# Patient Record
Sex: Female | Born: 1980 | Race: Black or African American | Hispanic: No | Marital: Single | Smoking: Never smoker
Health system: Southern US, Community
[De-identification: ages and names within clinical notes are randomized; demographics above are authoritative.]

## PROBLEM LIST (undated history)

## (undated) DIAGNOSIS — E119 Type 2 diabetes mellitus without complications: Secondary | ICD-10-CM

## (undated) HISTORY — PX: TUBAL LIGATION: SHX77

---

## 2012-01-31 ENCOUNTER — Emergency Department: Payer: Self-pay | Admitting: Emergency Medicine

## 2014-06-03 ENCOUNTER — Encounter: Payer: Self-pay | Admitting: Emergency Medicine

## 2014-06-03 ENCOUNTER — Emergency Department
Admission: EM | Admit: 2014-06-03 | Discharge: 2014-06-03 | Disposition: A | Discharge status: Home / Self Care | Payer: Self-pay | Attending: Emergency Medicine | Admitting: Emergency Medicine

## 2014-06-03 DIAGNOSIS — K029 Dental caries, unspecified: Secondary | ICD-10-CM | POA: Insufficient documentation

## 2014-06-03 DIAGNOSIS — S025XXA Fracture of tooth (traumatic), initial encounter for closed fracture: Secondary | ICD-10-CM

## 2014-06-03 DIAGNOSIS — K0381 Cracked tooth: Secondary | ICD-10-CM | POA: Insufficient documentation

## 2014-06-03 MED ORDER — AMOXICILLIN 500 MG PO TABS: 500.0000 mg | ORAL_TABLET | Freq: Three times a day (TID) | ORAL | Status: DC

## 2014-06-03 MED ORDER — OXYCODONE-ACETAMINOPHEN 5-325 MG PO TABS
ORAL_TABLET | ORAL | Status: AC
  Filled 2014-06-03: qty 1

## 2014-06-03 MED ORDER — OXYCODONE-ACETAMINOPHEN 5-325 MG PO TABS: 2.0000 | ORAL_TABLET | Freq: Once | ORAL | Status: DC

## 2014-06-03 MED ORDER — OXYCODONE-ACETAMINOPHEN 5-325 MG PO TABS: 1.0000 | ORAL_TABLET | ORAL | Status: DC | PRN

## 2014-06-03 NOTE — ED Notes (Signed)
Dental pain , left lower jaw pain , broken tooth to region of pain

## 2014-06-03 NOTE — ED Notes (Signed)
States she has broken teeth on lower gum  Increased pain and swelling

## 2014-06-03 NOTE — ED Provider Notes (Signed)
[facility] Emergency Department Provider Note    ____________________________________________  Time seen: ----------------------------------------- 9:20 AM on 06/03/2014 -----------------------------------------    I have reviewed the triage vital signs and the nursing notes.   HISTORY  Chief Complaint Dental Pain      HPI Regina Schmidt is a 34 y.o. female presents to the emergency room for evaluation of dental pain. Patient states that she's got to both sides of her lower mouth numbers 32 and 17. Complaining of pain. Has not been able to call the dentist. Try Motrin 800 last night with no relief. Describes pain as 10 over 10 at this time with some swelling noted to the left side of her face.    No past medical history on file.  There are no active problems to display for this patient.   No past surgical history on file.  Current Outpatient Rx  Name  Route  Sig  Dispense  Refill  . amoxicillin (AMOXIL) 500 MG tablet   Oral   Take 1 tablet (500 mg total) by mouth 3 (three) times daily.   30 tablet   0   . oxyCODONE-acetaminophen (ROXICET) 5-325 MG per tablet   Oral   Take 1-2 tablets by mouth every 4 (four) hours as needed for severe pain.   15 tablet   0     Allergies Review of patient's allergies indicates no known allergies.  No family history on file.  Social History History  Substance Use Topics  . Smoking status: Never Smoker   . Smokeless tobacco: Not on file  . Alcohol Use: No    Review of Systems  Constitutional: Negative for fever. Eyes: Negative for visual changes. ENT: Negative for sore throat. Cardiovascular: Negative for chest pain. Respiratory: Negative for shortness of breath. Gastrointestinal: Negative for abdominal pain, vomiting and diarrhea. Genitourinary: Negative for dysuria. Musculoskeletal: Negative for back pain. Skin: Negative for rash. Neurological: Negative for headaches, focal weakness or  numbness.   10-point ROS otherwise negative.  ____________________________________________   PHYSICAL EXAM:  VITAL SIGNS: ED Triage Vitals  Enc Vitals Group     BP 06/03/14 0836 167/87 mmHg     Pulse Rate 06/03/14 0836 72     Resp 06/03/14 0836 18     Temp 06/03/14 0836 99 F (37.2 C)     Temp Source 06/03/14 0836 Oral     SpO2 06/03/14 0836 100 %     Weight 06/03/14 0836 203 lb (92.08 kg)     Height 06/03/14 0836 5\' 2"  (1.575 m)     Head Cir --      Peak Flow --      Pain Score 06/03/14 0836 10     Pain Loc --      Pain Edu? --      Excl. in GC? --     Constitutional: Alert and oriented. Well appearing and in no distress. Eyes: Conjunctivae are normal. PERRL. Normal extraocular movements. ENT   Head: Normocephalic and atraumatic.   Nose: No congestion/rhinnorhea.   Mouth/Throat: Mucous membranes are moist. Positive dental caries noted. With right facial jaw swelling on the left side minimal.   Neck: No stridor. Hematological/Lymphatic/Immunilogical: No cervical lymphadenopathy. Cardiovascular: Normal rate, regular rhythm. Normal and symmetric distal pulses are present in all extremities. No murmurs, rubs, or gallops. Respiratory: Normal respiratory effort without tachypnea nor retractions. Breath sounds are clear and equal bilaterally. No wheezes/rales/rhonchi. Psychiatric: Mood and affect are normal. Speech and behavior are normal. Patient exhibits  appropriate insight and judgment.  ____________________________________________    LABS (pertinent positives/negatives)  Not applicable  ____________________________________________   EKG  Not applicable  ____________________________________________    RADIOLOGY  Not applicable  ____________________________________________   PROCEDURES  Procedure(s) performed: None  Critical Care performed: No  ____________________________________________   INITIAL IMPRESSION / ASSESSMENT AND PLAN /  ED COURSE  Pertinent labs & imaging results that were available during my care of the patient were reviewed by me and considered in my medical decision making (see chart for details).  Discussed findings with patient and need for follow-up care with dentist to get full resolution of symptoms. Patient understands no other EMC at this time.  ____________________________________________   FINAL CLINICAL IMPRESSION(S) / ED DIAGNOSES  Final diagnoses:  Dental caries  Tooth fracture, closed, initial encounter     Evangeline DakinCharles M Lovada Barwick, PA-C 06/03/14 1039  Jene Everyobert Kinner, MD 06/03/14 [phone]

## 2014-06-03 NOTE — Discharge Instructions (Signed)
OPTIONS FOR DENTAL FOLLOW UP CARE ° °[place] [facility] and Human Services - Local Safety Net Dental Clinics °http://www.ncdhhs.gov/dph/oralhealth/services/safetynetclinics.htm °  °[facility] (336-562-3123) ° °[facility] [place] (919-933-9087) ° °[facility] ([phone] ext [phone]) ° °[facility] ([phone]) ° °[facility] ([phone]) °This clinic caters to the indigent population and is on a lottery system. °Location: °[facility], [address], [place] °Clinic Hours: °Wednesdays from 6pm - 9pm, patients seen by a lottery system. °For dates, call or go to [email or web address]/[facility]/[email or web address] °Services: °Cleanings, fillings and simple extractions. °Payment Options: °DENTAL WORK IS FREE OF CHARGE. Bring proof of income or support. °Best way to get seen: °Arrive at 5:15 pm - this is a lottery, NOT first come/first serve, so arriving earlier will not increase your chances of being seen. °  °  °[facility] °[phone] °Select option 1 for emergencies °  °Location: °[facility], [address], [place] °Clinic Hours: °No walk-ins accepted - call the day before to schedule an appointment. °Check in times are 9:30 am and 1:30 pm. °Services: °Simple extractions, temporary fillings, pulpectomy/pulp debridement, uncomplicated abscess drainage. °Payment Options: °PAYMENT IS DUE AT THE TIME OF SERVICE.  Fee is usually $100-200, additional surgical procedures (e.g. abscess drainage) may be extra. °Cash, checks, Visa/MasterCard accepted.  Can file Medicaid if patient is covered for dental - patient should call case worker to check. °No discount for [facility] patients. °Best way to get seen: °MUST call the day before and get onto the schedule. Can usually be seen the next 1-2 days. No walk-ins accepted. °  °  °[facility] °[phone] °   °Location: °[facility], [address], [place] °Clinic Hours: °M, W, Th, F 8am or 1:30pm, Tues 9a or 1:30 - first come/first served. °Services: °Simple extractions, temporary fillings, uncomplicated abscess drainage.  You do not need to be an [place] resident. °Payment Options: °PAYMENT IS DUE AT THE TIME OF SERVICE. °Dental insurance, otherwise sliding scale - bring proof of income or support. °Depending on income and treatment needed, cost is usually $50-200. °Best way to get seen: °Arrive early as it is first come/first served. °  °  °[facility] °[phone] °  °Location: °[address] °Clinic Hours: °Mon-Thu 8a-5p °Services: °Most basic dental services including extractions and fillings. °Payment Options: °PAYMENT IS DUE AT THE TIME OF SERVICE. °Sliding scale, up to 50% off - bring proof if income or support. °Medicaid with dental option accepted. °Best way to get seen: °Call to schedule an appointment, can usually be seen within 2 weeks OR they will try to see walk-ins - show up at 8a or 2p (you may have to wait). °  °  °[facility] °919-245-2435 °[place] RESIDENTS ONLY °  °Location: °[facility], [address], [place],  [address] °Clinic Hours: By appointment only. °Monday - Thursday 8am-5pm, Friday 8am-12pm °Services: Cleanings, fillings, extractions. °Payment Options: °PAYMENT IS DUE AT THE TIME OF SERVICE. °Cash, Visa or MasterCard. Sliding scale - $30 minimum per service. °Best way to get seen: °Come in to office, complete packet and make an appointment - need proof of income °or support monies for each household member and proof of [place] residence. °Usually takes about a month to get in. °  °  °[facility] °[phone] °  °Location: °[address]   [place] °Clinic Hours: Walk-in Urgent Care Dental Services are offered Monday-Friday  mornings only. °The numbers of emergencies accepted daily is limited to the number of °providers available. °Maximum 15 - Mondays, Wednesdays & Thursdays °Maximum 10 - Tuesdays & Fridays °Services: °You do not need to be a [place] resident to be seen for a dental emergency. °Emergencies are defined as pain, swelling, abnormal bleeding, or dental trauma. Walkins will receive x-rays if needed. °NOTE: Dental cleaning is not an emergency. °Payment Options: °PAYMENT IS DUE AT THE TIME OF SERVICE. °Minimum co-pay is $40.00 for uninsured patients. °Minimum co-pay is $3.00 for Medicaid with dental coverage. °Dental Insurance is accepted and must be presented at time of visit. °Medicare does not cover dental. °Forms of payment: Cash, credit card, checks. °Best way to get seen: °If not previously registered with the clinic, walk-in dental registration begins at 7:15 am and is on a first come/first serve basis. °If previously registered with the clinic, call to make an appointment. °  °  °[facility] °[phone] °[place] RESIDENTS ONLY °  °Location: °[address], [place], [place] °Clinic Hours: °Mon-Thu 10a-2p °Services: Extractions only! °Payment Options: °FREE (donations accepted) - bring proof of income or support °Best way to get seen: °Call and schedule an appointment OR come at 8am on the 1st Monday of every month (except for holidays) when it is first come/first served. °  °  °[facility] °[phone] °  °Location: °[address], [place] °Clinic Hours: °Friday mornings °Services, Payment Options, Best way to get seen: °Call for info °Dental Caries °Dental caries (also called tooth decay) is the most common oral disease. It can occur at any age but is more common in children and young adults.  °HOW DENTAL CARIES DEVELOPS  °The process of decay begins when bacteria and foods (particularly sugars and starches) combine in your mouth to produce plaque. Plaque is a substance that sticks to the  hard, outer surface of a tooth (enamel). The bacteria in plaque produce acids that attack enamel. These acids may also attack the root surface of a tooth (cementum) if it is exposed. Repeated attacks dissolve these surfaces and create holes in the tooth (cavities). If left untreated, the acids destroy the other layers of the tooth.  °RISK FACTORS °· Frequent sipping of sugary beverages.   °· Frequent snacking on sugary and starchy foods, especially those that easily get stuck in the teeth.   °· Poor oral hygiene.   °· Dry mouth.   °· Substance abuse such as methamphetamine abuse.   °· Broken or poor-fitting dental restorations.   °· Eating disorders.   °· Gastroesophageal reflux disease (GERD).   °· Certain radiation treatments to the head and neck. °SYMPTOMS °In the early stages of dental caries, symptoms are seldom present. Sometimes white, chalky areas may be seen on the enamel or other tooth layers. In later stages, symptoms may include: °· Pits and holes on the enamel. °· Toothache after sweet, hot, or cold foods or drinks are consumed. °· Pain around the tooth. °· Swelling around the tooth. °DIAGNOSIS  °Most of the time, dental caries is detected during a regular dental checkup. A diagnosis is made after a thorough medical and dental history is taken and the surfaces of your teeth are checked for signs of dental caries. Sometimes special instruments, such as lasers, are used to check for dental caries. Dental X-ray exams may be taken so that areas not visible to the eye (such as between the contact areas of the   teeth) can be checked for cavities.  TREATMENT  If dental caries is in its early stages, it may be reversed with a fluoride treatment or an application of a remineralizing agent at the dental office. Thorough brushing and flossing at home is needed to aid these treatments. If it is in its later stages, treatment depends on the location and extent of tooth destruction:   If a small area of the tooth  has been destroyed, the destroyed area will be removed and cavities will be filled with a material such as gold, silver amalgam, or composite resin.   If a large area of the tooth has been destroyed, the destroyed area will be removed and a cap (crown) will be fitted over the remaining tooth structure.   If the center part of the tooth (pulp) is affected, a procedure called a root canal will be needed before a filling or crown can be placed.   If most of the tooth has been destroyed, the tooth may need to be pulled (extracted). HOME CARE INSTRUCTIONS You can prevent, stop, or reverse dental caries at home by practicing good oral hygiene. Good oral hygiene includes:  Thoroughly cleaning your teeth at least twice a day with a toothbrush and dental floss.   Using a fluoride toothpaste. A fluoride mouth rinse may also be used if recommended by your dentist or health care provider.   Restricting the amount of sugary and starchy foods and sugary liquids you consume.   Avoiding frequent snacking on these foods and sipping of these liquids.   Keeping regular visits with a dentist for checkups and cleanings. PREVENTION   Practice good oral hygiene.  Consider a dental sealant. A dental sealant is a coating material that is applied by your dentist to the pits and grooves of teeth. The sealant prevents food from being trapped in them. It may protect the teeth for several years.  Ask about fluoride supplements if you live in a community without fluorinated water or with water that has a low fluoride content. Use fluoride supplements as directed by your dentist or health care provider.  Allow fluoride varnish applications to teeth if directed by your dentist or health care provider. Document Released: 10/07/2001 Document Revised: 06/01/2013 Document Reviewed: 01/18/2012 [facility] Patient Information 2015 [place]. This information is not intended to replace advice given to you by your  health care provider. Make sure you discuss any questions you have with your health care provider.

## 2014-12-11 ENCOUNTER — Emergency Department
Admission: EM | Admit: 2014-12-11 | Discharge: 2014-12-11 | Disposition: A | Discharge status: Home / Self Care | Payer: Self-pay | Attending: Emergency Medicine | Admitting: Emergency Medicine

## 2014-12-11 DIAGNOSIS — L03313 Cellulitis of chest wall: Secondary | ICD-10-CM | POA: Insufficient documentation

## 2014-12-11 DIAGNOSIS — E119 Type 2 diabetes mellitus without complications: Secondary | ICD-10-CM | POA: Insufficient documentation

## 2014-12-11 DIAGNOSIS — Z792 Long term (current) use of antibiotics: Secondary | ICD-10-CM | POA: Insufficient documentation

## 2014-12-11 HISTORY — DX: Type 2 diabetes mellitus without complications: E11.9

## 2014-12-11 MED ORDER — SULFAMETHOXAZOLE-TRIMETHOPRIM 800-160 MG PO TABS: 1.0000 | ORAL_TABLET | Freq: Two times a day (BID) | ORAL | Status: DC

## 2014-12-11 MED ORDER — HYDROCODONE-ACETAMINOPHEN 5-325 MG PO TABS
1.0000 | ORAL_TABLET | ORAL | Status: DC | PRN
Start: 2014-12-11 — End: 2016-09-02

## 2014-12-11 NOTE — ED Notes (Signed)
Pt c/o tender.swollen area under the right breast for the past 2-3 days

## 2014-12-11 NOTE — Discharge Instructions (Signed)
Cellulitis Cellulitis is an infection of the skin and the tissue under the skin. The infected area is usually red and tender. This happens most often in the arms and lower legs. HOME CARE   Take your antibiotic medicine as told. Finish the medicine even if you start to feel better.  Keep the infected arm or leg raised (elevated).  Put a warm cloth on the area up to 4 times per day.  Only take medicines as told by your doctor.  Keep all doctor visits as told. GET HELP IF:  You see red streaks on the skin coming from the infected area.  Your red area gets bigger or turns a dark color.  Your bone or joint under the infected area is painful after the skin heals.  Your infection comes back in the same area or different area.  You have a puffy (swollen) bump in the infected area.  You have new symptoms.  You have a fever. GET HELP RIGHT AWAY IF:   You feel very sleepy.  You throw up (vomit) or have watery poop (diarrhea).  You feel sick and have muscle aches and pains.   This information is not intended to replace advice given to you by your health care provider. Make sure you discuss any questions you have with your health care provider.   Document Released: 07/04/2007 Document Revised: 10/06/2014 Document Reviewed: 04/02/2011 Elsevier Interactive Patient Education 2016 [employer] Inc.    Warm moist compresses frequently to the area. Take medication as prescribed. Return to the emergency room if any severe worsening of her symptoms or no drainage in the next 1-2 days.

## 2014-12-11 NOTE — ED Provider Notes (Signed)
[facility] Emergency Department Provider Note  ____________________________________________  Time seen: Approximately 10:09 AM  I have reviewed the triage vital signs and the nursing notes.   HISTORY  Chief Complaint Abscess  HPI Regina Schmidt is a 34 y.o. female is here with complaint of swollen tender area just under her right breast for the last 2-3 days. Patient states she had one prior to this it opened and drained on its on. She states this is very painful. She has not taken any over-the-counter medication for this. She is unaware of having MRSA in the past. She rates her pain an 8 out of 10. Pain is constant and worse since she touches it or if she moves a certain way. She states nothing is helping with pain.   Past Medical History  Diagnosis Date  . Diabetes mellitus without complication (HCC)     gestational    There are no active problems to display for this patient.   Past Surgical History  Procedure Laterality Date  . Tubal ligation    . Cesarean section      Current Outpatient Rx  Name  Route  Sig  Dispense  Refill  . amoxicillin (AMOXIL) 500 MG tablet   Oral   Take 1 tablet (500 mg total) by mouth 3 (three) times daily.   30 tablet   0   . HYDROcodone-acetaminophen (NORCO/VICODIN) 5-325 MG tablet   Oral   Take 1 tablet by mouth every 4 (four) hours as needed for moderate pain.   20 tablet   0   . oxyCODONE-acetaminophen (ROXICET) 5-325 MG per tablet   Oral   Take 1-2 tablets by mouth every 4 (four) hours as needed for severe pain.   15 tablet   0   . sulfamethoxazole-trimethoprim (BACTRIM DS,SEPTRA DS) 800-160 MG tablet   Oral   Take 1 tablet by mouth 2 (two) times daily.   20 tablet   0     Allergies Review of patient's allergies indicates no known allergies.  No family history on file.  Social History Social History  Substance Use Topics  . Smoking status: Never Smoker   . Smokeless tobacco: None  .  Alcohol Use: No    Review of Systems Constitutional: No fever/chills ENT: No sore throat. Cardiovascular: Denies chest pain. Respiratory: Denies shortness of breath. Gastrointestinal:   No nausea, no vomiting.  Genitourinary: Negative for dysuria. Musculoskeletal: Negative for back pain. Skin: Positive abscess  Neurological: Negative for headaches, focal weakness or numbness.  10-point ROS otherwise negative.  ____________________________________________   PHYSICAL EXAM:  VITAL SIGNS: ED Triage Vitals  Enc Vitals Group     BP 12/11/14 0923 150/69 mmHg     Pulse Rate 12/11/14 0923 95     Resp 12/11/14 0923 18     Temp 12/11/14 0923 99 F (37.2 C)     Temp Source 12/11/14 0923 Oral     SpO2 12/11/14 0923 98 %     Weight 12/11/14 0923 202 lb (91.627 kg)     Height 12/11/14 0923 5\' 2"  (1.575 m)     Head Cir --      Peak Flow --      Pain Score 12/11/14 0924 8     Pain Loc --      Pain Edu? --      Excl. in GC? --     Constitutional: Alert and oriented. Well appearing and in no acute distress. Eyes: Conjunctivae are normal. PERRL. EOMI.  Head: Atraumatic. Nose: No congestion/rhinnorhea. Neck: No stridor.   Cardiovascular: Normal rate, regular rhythm. Grossly normal heart sounds.  Good peripheral circulation. Respiratory: Normal respiratory effort.  No retractions. Lungs CTAB. Gastrointestinal: Soft and nontender. No distention.  Musculoskeletal: Moves upper and lower extremities without any difficulty. No lower extremity tenderness nor edema.  No joint effusions. Neurologic:  Normal speech and language. No gross focal neurologic deficits are appreciated. No gait instability. Skin:  Skin is warm, dry and intact. Right side lateral chest there is a 4 cm erythematous area that is extremely tender to touch. There is no localized fluctuant area.  Psychiatric: Mood and affect are normal. Speech and behavior are normal.  ____________________________________________    LABS (all labs ordered are listed, but only abnormal results are displayed)  Labs Reviewed - No data to display  PROCEDURES  Procedure(s) performed: None  Critical Care performed: No  ____________________________________________   INITIAL IMPRESSION / ASSESSMENT AND PLAN / ED COURSE  Pertinent labs & imaging results that were available during my care of the patient were reviewed by me and considered in my medical decision making (see chart for details).  Patient was placed on Bactrim DS twice a day for 10 days. She is also given a prescription for Norco as needed for pain. She is instructed to use warm compresses to the area frequently and return to the emergency room if area becomes soft or not draining in 2 days ____________________________________________   FINAL CLINICAL IMPRESSION(S) / ED DIAGNOSES  Final diagnoses:  Cellulitis of chest wall      Tommi Rumps, PA-C 12/11/14 1402  Darien Ramus, MD 12/11/14 1517

## 2014-12-15 ENCOUNTER — Emergency Department
Admission: EM | Admit: 2014-12-15 | Discharge: 2014-12-15 | Disposition: A | Discharge status: Home / Self Care | Payer: Self-pay | Attending: Emergency Medicine | Admitting: Emergency Medicine

## 2014-12-15 DIAGNOSIS — Z79899 Other long term (current) drug therapy: Secondary | ICD-10-CM | POA: Insufficient documentation

## 2014-12-15 DIAGNOSIS — L0291 Cutaneous abscess, unspecified: Secondary | ICD-10-CM

## 2014-12-15 DIAGNOSIS — L02211 Cutaneous abscess of abdominal wall: Secondary | ICD-10-CM | POA: Insufficient documentation

## 2014-12-15 DIAGNOSIS — Z792 Long term (current) use of antibiotics: Secondary | ICD-10-CM | POA: Insufficient documentation

## 2014-12-15 DIAGNOSIS — E119 Type 2 diabetes mellitus without complications: Secondary | ICD-10-CM | POA: Insufficient documentation

## 2014-12-15 MED ORDER — LIDOCAINE-EPINEPHRINE (PF) 1 %-1:200000 IJ SOLN
30.0000 mL | Freq: Once | INTRAMUSCULAR | Status: AC
  Administered 2014-12-15: 30 mL

## 2014-12-15 MED ORDER — HYDROCODONE-ACETAMINOPHEN 5-325 MG PO TABS
ORAL_TABLET | ORAL | Status: AC
  Administered 2014-12-15: 1 via ORAL
  Filled 2014-12-15: qty 1

## 2014-12-15 MED ORDER — HYDROCODONE-ACETAMINOPHEN 5-325 MG PO TABS
1.0000 | ORAL_TABLET | Freq: Once | ORAL | Status: AC
  Administered 2014-12-15: 1 via ORAL

## 2014-12-15 MED ORDER — LIDOCAINE-EPINEPHRINE (PF) 1 %-1:200000 IJ SOLN
INTRAMUSCULAR | Status: AC
  Administered 2014-12-15: 30 mL
  Filled 2014-12-15: qty 30

## 2014-12-15 NOTE — ED Provider Notes (Signed)
[facility] Emergency Department Provider Note ____________________________________________  Time seen: Approximately 8:35 AM  I have reviewed the triage vital signs and the nursing notes.   HISTORY  Chief Complaint Wound Check   HPI Regina Schmidt is a 34 y.o. female who presents to the emergency department for a second evaluation of abscess on the upper abdomen under the right breast. She has been taking bactrim as prescribed on 12/11/14. The area is smaller in size, but remains painful. She denies fever.  Past Medical History  Diagnosis Date  . Diabetes mellitus without complication (HCC)     gestational    There are no active problems to display for this patient.   Past Surgical History  Procedure Laterality Date  . Tubal ligation    . Cesarean section      Current Outpatient Rx  Name  Route  Sig  Dispense  Refill  . amoxicillin (AMOXIL) 500 MG tablet   Oral   Take 1 tablet (500 mg total) by mouth 3 (three) times daily.   30 tablet   0   . HYDROcodone-acetaminophen (NORCO/VICODIN) 5-325 MG tablet   Oral   Take 1 tablet by mouth every 4 (four) hours as needed for moderate pain.   20 tablet   0   . oxyCODONE-acetaminophen (ROXICET) 5-325 MG per tablet   Oral   Take 1-2 tablets by mouth every 4 (four) hours as needed for severe pain.   15 tablet   0   . sulfamethoxazole-trimethoprim (BACTRIM DS,SEPTRA DS) 800-160 MG tablet   Oral   Take 1 tablet by mouth 2 (two) times daily.   20 tablet   0     Allergies Review of patient's allergies indicates no known allergies.  No family history on file.  Social History Social History  Substance Use Topics  . Smoking status: Never Smoker   . Smokeless tobacco: None  . Alcohol Use: No    Review of Systems   Constitutional: No fever/chills Eyes: No visual changes. ENT: No congestion or rhinorrhea Cardiovascular: Denies chest pain. Respiratory: Denies shortness of  breath. Gastrointestinal: No abdominal pain.  No nausea, no vomiting.  No diarrhea.  No constipation. Genitourinary: Negative for dysuria. Musculoskeletal: Negative for back pain. Skin: Tenderness to upper abdomen under right breast. Neurological: Negative for headaches, focal weakness or numbness. 10-point ROS otherwise negative.  ____________________________________________   PHYSICAL EXAM:  VITAL SIGNS: ED Triage Vitals  Enc Vitals Group     BP 12/15/14 0812 131/77 mmHg     Pulse Rate 12/15/14 0812 99     Resp 12/15/14 0812 20     Temp 12/15/14 0812 98.7 F (37.1 C)     Temp Source 12/15/14 0812 Oral     SpO2 12/15/14 0812 100 %     Weight 12/15/14 0812 207 lb (93.895 kg)     Height 12/15/14 0812 5\' 7"  (1.702 m)     Head Cir --      Peak Flow --      Pain Score 12/15/14 0812 6     Pain Loc --      Pain Edu? --      Excl. in GC? --    Constitutional: Alert and oriented. Well appearing and in no acute distress. Eyes: Conjunctivae are normal. PERRL. EOMI. Head: Atraumatic. Nose: No congestion/rhinnorhea. Mouth/Throat: Mucous membranes are moist.  Oropharynx non-erythematous. No oral lesions. Neck: No stridor. Cardiovascular: Normal rate, regular rhythm.  Good peripheral circulation. Respiratory: Normal respiratory effort.  No retractions. Lungs CTAB. Gastrointestinal: Soft and nontender. No distention. No abdominal bruits.  Musculoskeletal: No lower extremity tenderness nor edema.  No joint effusions. Neurologic:  Normal speech and language. No gross focal neurologic deficits are appreciated. Speech is normal. No gait instability. Skin:  Tender, erythematous, fluctuant area approximately 4cm under right breast; Negative for petechiae.  Psychiatric: Mood and affect are normal. Speech and behavior are normal.  ____________________________________________   LABS (all labs ordered are listed, but only abnormal results are displayed)  Labs Reviewed - No data to  display ____________________________________________  EKG   ____________________________________________  RADIOLOGY  Not indicated ____________________________________________   PROCEDURES  Procedure(s) performed:  INCISION AND DRAINAGE Performed by: Kem Boroughs Consent: Verbal consent obtained. Risks and benefits: risks, benefits and alternatives were discussed Type: abscess  Body area: right upper abdomen under right breast  Anesthesia: local infiltration  Incision was made with a scalpel.  Local anesthetic: lidocaine 1% with epinephrine  Anesthetic total: 6 ml  Complexity: complex  Blunt dissection to break up loculations  Drainage: purulent  Drainage amount: Large  Packing material: 1/4 in iodoform gauze  Patient tolerance: Patient tolerated the procedure well with no immediate complications.    ____________________________________________   INITIAL IMPRESSION / ASSESSMENT AND PLAN / ED COURSE  Pertinent labs & imaging results that were available during my care of the patient were reviewed by me and considered in my medical decision making (see chart for details).  Patient to return in 2 days for packing removal and recheck or see PCP. She is to return sooner for symptoms that change or worsen or for new concerns. She will continue the antibiotic as prescribed. ____________________________________________   FINAL CLINICAL IMPRESSION(S) / ED DIAGNOSES  Final diagnoses:  Abscess       Chinita Pester, FNP 12/15/14 [id number]  Emily Filbert, MD 12/15/14 [phone]

## 2014-12-15 NOTE — Discharge Instructions (Signed)
Incision and Drainage °Incision and drainage is a procedure in which a sac-like structure (cystic structure) is opened and drained. The area to be drained usually contains material such as pus, fluid, or blood.  °LET YOUR CAREGIVER KNOW ABOUT:  °· Allergies to medicine. °· Medicines taken, including vitamins, herbs, eyedrops, over-the-counter medicines, and creams. °· Use of steroids (by mouth or creams). °· Previous problems with anesthetics or numbing medicines. °· History of bleeding problems or blood clots. °· Previous surgery. °· Other health problems, including diabetes and kidney problems. °· Possibility of pregnancy, if this applies. °RISKS AND COMPLICATIONS °· Pain. °· Bleeding. °· Scarring. °· Infection. °BEFORE THE PROCEDURE  °You may need to have an ultrasound or other imaging tests to see how large or deep your cystic structure is. Blood tests may also be used to determine if you have an infection or how severe the infection is. You may need to have a tetanus shot. °PROCEDURE  °The affected area is cleaned with a cleaning fluid. The cyst area will then be numbed with a medicine (local anesthetic). A small incision will be made in the cystic structure. A syringe or catheter may be used to drain the contents of the cystic structure, or the contents may be squeezed out. The area will then be flushed with a cleansing solution. After cleansing the area, it is often gently packed with a gauze or another wound dressing. Once it is packed, it will be covered with gauze and tape or some other type of wound dressing.  °AFTER THE PROCEDURE  °· Often, you will be allowed to go home right after the procedure. °· You may be given antibiotic medicine to prevent or heal an infection. °· If the area was packed with gauze or some other wound dressing, you will likely need to come back in 1 to 2 days to get it removed. °· The area should heal in about 14 days. °  °This information is not intended to replace advice given  to you by your health care provider. Make sure you discuss any questions you have with your health care provider. °  °Document Released: 07/11/2000 Document Revised: 07/17/2011 Document Reviewed: 03/12/2011 °Elsevier Interactive Patient Education ©2016 [employer] ° °Abscess °An abscess is an infected area that contains a collection of pus and debris. It can occur in almost any part of the body. An abscess is also known as a furuncle or boil. °CAUSES  °An abscess occurs when tissue gets infected. This can occur from blockage of oil or sweat glands, infection of hair follicles, or a minor injury to the skin. As the body tries to fight the infection, pus collects in the area and creates pressure under the skin. This pressure causes pain. People with weakened immune systems have difficulty fighting infections and get certain abscesses more often.  °SYMPTOMS °Usually an abscess develops on the skin and becomes a painful mass that is red, warm, and tender. If the abscess forms under the skin, you may feel a moveable soft area under the skin. Some abscesses break open (rupture) on their own, but most will continue to get worse without care. The infection can spread deeper into the body and eventually into the bloodstream, causing you to feel ill.  °DIAGNOSIS  °Your caregiver will take your medical history and perform a physical exam. A sample of fluid may also be taken from the abscess to determine what is causing your infection. °TREATMENT  °Your caregiver may prescribe antibiotic medicines to fight the infection.   However, taking antibiotics alone usually does not cure an abscess. Your caregiver may need to make a small cut (incision) in the abscess to drain the pus. In some cases, gauze is packed into the abscess to reduce pain and to continue draining the area. °HOME CARE INSTRUCTIONS  °· Only take over-the-counter or prescription medicines for pain, discomfort, or fever as directed by your caregiver. °· If you were  prescribed antibiotics, take them as directed. Finish them even if you start to feel better. °· If gauze is used, follow your caregiver's directions for changing the gauze. °· To avoid spreading the infection: °¨ Keep your draining abscess covered with a bandage. °¨ Wash your hands well. °¨ Do not share personal care items, towels, or whirlpools with others. °¨ Avoid skin contact with others. °· Keep your skin and clothes clean around the abscess. °· Keep all follow-up appointments as directed by your caregiver. °SEEK MEDICAL CARE IF:  °· You have increased pain, swelling, redness, fluid drainage, or bleeding. °· You have muscle aches, chills, or a general ill feeling. °· You have a fever. °MAKE SURE YOU:  °· Understand these instructions. °· Will watch your condition. °· Will get help right away if you are not doing well or get worse. °  °This information is not intended to replace advice given to you by your health care provider. Make sure you discuss any questions you have with your health care provider. °  °Document Released: 10/25/2004 Document Revised: 07/17/2011 Document Reviewed: 03/30/2011 °Elsevier Interactive Patient Education ©2016 [employer] ° °

## 2014-12-15 NOTE — ED Notes (Addendum)
Pt was seen a few days ago for abscess on right upper abd, states she was placed on abx and returns today because abscess is still present

## 2014-12-15 NOTE — ED Notes (Signed)
Pt states she is here for a wound check of abscess on abdomin

## 2016-09-02 ENCOUNTER — Encounter: Payer: Self-pay | Admitting: Emergency Medicine

## 2016-09-02 DIAGNOSIS — Z043 Encounter for examination and observation following other accident: Secondary | ICD-10-CM | POA: Diagnosis present
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
## 2016-09-03 ENCOUNTER — Emergency Department
Admission: EM | Admit: 2016-09-03 | Discharge: 2016-09-03 | Disposition: A | Discharge status: Home / Self Care | Payer: No Typology Code available for payment source | Attending: Emergency Medicine | Admitting: Emergency Medicine

## 2016-09-03 MED ORDER — IBUPROFEN 600 MG PO TABS
600.0000 mg | ORAL_TABLET | Freq: Once | ORAL | Status: AC
  Administered 2016-09-03: 600 mg via ORAL
  Filled 2016-09-03: qty 1

## 2016-09-03 MED ORDER — IBUPROFEN 600 MG PO TABS: 600.0000 mg | ORAL_TABLET | Freq: Three times a day (TID) | ORAL | 0 refills | Status: DC | PRN
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
## 2016-09-03 NOTE — ED Triage Notes (Signed)
Patient ambulating outside. 

## 2017-04-19 ENCOUNTER — Ambulatory Visit: Payer: Self-pay | Admitting: Obstetrics and Gynecology

## 2017-09-21 ENCOUNTER — Encounter: Payer: Self-pay | Admitting: Emergency Medicine

## 2017-09-21 ENCOUNTER — Emergency Department
Admission: EM | Admit: 2017-09-21 | Discharge: 2017-09-21 | Disposition: A | Discharge status: Home / Self Care | Payer: Self-pay | Attending: Emergency Medicine | Admitting: Emergency Medicine

## 2017-09-21 DIAGNOSIS — S39012A Strain of muscle, fascia and tendon of lower back, initial encounter: Secondary | ICD-10-CM | POA: Insufficient documentation

## 2017-09-21 DIAGNOSIS — E119 Type 2 diabetes mellitus without complications: Secondary | ICD-10-CM | POA: Insufficient documentation

## 2017-09-21 DIAGNOSIS — Y92410 Unspecified street and highway as the place of occurrence of the external cause: Secondary | ICD-10-CM | POA: Insufficient documentation

## 2017-09-21 DIAGNOSIS — Y999 Unspecified external cause status: Secondary | ICD-10-CM | POA: Insufficient documentation

## 2017-09-21 DIAGNOSIS — Y9389 Activity, other specified: Secondary | ICD-10-CM | POA: Insufficient documentation

## 2017-09-21 MED ORDER — IBUPROFEN 600 MG PO TABS: 600.0000 mg | ORAL_TABLET | Freq: Four times a day (QID) | ORAL | 0 refills | Status: DC | PRN

## 2017-09-21 MED ORDER — CYCLOBENZAPRINE HCL 5 MG PO TABS: 5.0000 mg | ORAL_TABLET | Freq: Three times a day (TID) | ORAL | 0 refills | Status: DC | PRN
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
## 2017-09-21 NOTE — Discharge Instructions (Addendum)
Please use Tylenol and ibuprofen for mild to moderate pain.  Use Flexeril as needed for muscle tightness and pain.  If no improvement 1 week follow-up with primary care provider.  If any worsening symptoms or urgent changes in health return to the emergency department.
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
## 2018-08-06 ENCOUNTER — Ambulatory Visit (INDEPENDENT_AMBULATORY_CARE_PROVIDER_SITE_OTHER): Payer: Managed Care, Other (non HMO)

## 2018-08-06 ENCOUNTER — Ambulatory Visit
Admission: EM | Admit: 2018-08-06 | Discharge: 2018-08-06 | Disposition: A | Discharge status: Home / Self Care | Payer: Managed Care, Other (non HMO) | Attending: Family Medicine | Admitting: Family Medicine

## 2018-08-06 ENCOUNTER — Other Ambulatory Visit: Payer: Self-pay

## 2018-08-06 ENCOUNTER — Encounter: Payer: Self-pay | Admitting: Emergency Medicine

## 2018-08-06 DIAGNOSIS — R05 Cough: Secondary | ICD-10-CM

## 2018-08-06 DIAGNOSIS — R0602 Shortness of breath: Secondary | ICD-10-CM | POA: Diagnosis not present

## 2018-08-06 DIAGNOSIS — R053 Chronic cough: Secondary | ICD-10-CM

## 2018-08-06 MED ORDER — ALBUTEROL SULFATE HFA 108 (90 BASE) MCG/ACT IN AERS: 1.0000 | INHALATION_SPRAY | Freq: Four times a day (QID) | RESPIRATORY_TRACT | 0 refills | Status: DC | PRN

## 2018-08-06 NOTE — ED Provider Notes (Signed)
[facility]    CSN: [id number] Arrival date & time: 08/06/18  1206  History   Chief Complaint Chief Complaint  Patient presents with  . Cough   HPI  38 year old female presents with cough, fatigue, SOB.  Patient reports that she has had an ongoing cough for the past 6 months.  She has yet to establish with a primary care provider.  She states that since Monday she has felt worse.  More fatigue.  Worsening cough.  Associated shortness of breath and chest congestion.  No documented fever.  No medications or interventions tried.  No reported sick contacts.  No other associated symptoms. No other complaints.  History reviewed as below. Past Medical History:  Diagnosis Date  . Diabetes mellitus without complication (HCC)    gestational  Dyspareunia  Past Surgical History:  Procedure Laterality Date  . CESAREAN SECTION    . TUBAL LIGATION     OB History   No obstetric history on file.    Home Medications    Prior to Admission medications   Medication Sig Start Date End Date Taking? Authorizing Provider  albuterol (VENTOLIN HFA) 108 (90 Base) MCG/ACT inhaler Inhale 1-2 puffs into the lungs every 6 (six) hours as needed for wheezing or shortness of breath. 08/06/18   Tommie Samsook, Raynaldo Falco G, DO   Social History Social History   Tobacco Use  . Smoking status: Never Smoker  . Smokeless tobacco: Never Used  Substance Use Topics  . Alcohol use: No  . Drug use: No    Allergies   Patient has no known allergies.   Review of Systems Review of Systems  Constitutional: Positive for fatigue. Negative for fever.  Respiratory: Positive for cough and shortness of breath.    Physical Exam Triage Vital Signs ED Triage Vitals  Enc Vitals Group     BP 08/06/18 1222 (!) 145/80     Pulse Rate 08/06/18 1222 82     Resp 08/06/18 1222 18     Temp 08/06/18 1222 99.1 F (37.3 C)     Temp Source 08/06/18 1222 Oral     SpO2 08/06/18 1222 100 %     Weight 08/06/18 1220 210 lb (95.3  kg)     Height 08/06/18 1220 5\' 4"  (1.626 m)     Head Circumference --      Peak Flow --      Pain Score 08/06/18 1218 5     Pain Loc --      Pain Edu? --      Excl. in GC? --    Updated Vital Signs BP (!) 145/80 (BP Location: Right Arm)   Pulse 82   Temp 99.1 F (37.3 C) (Oral)   Resp 18   Ht 5\' 4"  (1.626 m)   Wt 95.3 kg   LMP 07/30/2018   SpO2 100%   BMI 36.05 kg/m   Visual Acuity Right Eye Distance:   Left Eye Distance:   Bilateral Distance:    Right Eye Near:   Left Eye Near:    Bilateral Near:     Physical Exam Vitals signs and nursing note reviewed.  Constitutional:      General: She is not in acute distress.    Appearance: Normal appearance.  HENT:     Head: Normocephalic and atraumatic.  Eyes:     General:        Right eye: No discharge.        Left eye: No discharge.  Conjunctiva/sclera: Conjunctivae normal.  Cardiovascular:     Rate and Rhythm: Normal rate and regular rhythm.  Pulmonary:     Effort: Pulmonary effort is normal.     Breath sounds: Normal breath sounds. No wheezing, rhonchi or rales.  Neurological:     Mental Status: She is alert.  Psychiatric:        Mood and Affect: Mood normal.        Behavior: Behavior normal.    UC Treatments / Results  Labs (all labs ordered are listed, but only abnormal results are displayed) Labs Reviewed - No data to display  EKG   Radiology Dg Chest 2 View  Result Date: 08/06/2018 CLINICAL DATA:  Cough for 6 months. EXAM: CHEST - 2 VIEW COMPARISON:  None. FINDINGS: The heart size and mediastinal contours are within normal limits. There is mild linear atelectasis of right lung base. There is no focal infiltrate, pulmonary edema, or pleural effusion. The visualized skeletal structures are unremarkable. IMPRESSION: No focal pneumonia or pulmonary edema. Minimal atelectasis of right lung base. Electronically Signed   By: Abelardo Diesel M.D.   On: 08/06/2018 12:52    Procedures Procedures (including  critical care time)  Medications Ordered in UC Medications - No data to display  Initial Impression / Assessment and Plan / UC Course  I have reviewed the triage vital signs and the nursing notes.  Pertinent labs & imaging results that were available during my care of the patient were reviewed by me and considered in my medical decision making (see chart for details).    38 year old female presents with chronic cough with recent worsening. Chest xray negative. Albuterol as needed. Arranging COVID testing.  Final Clinical Impressions(s) / UC Diagnoses   Final diagnoses:  Chronic cough     Discharge Instructions     Chest xray negative.  Inhaler as prescribed.  You will receive a call regarding COVID testing.    ED Prescriptions    Medication Sig Dispense Auth. Provider   albuterol (VENTOLIN HFA) 108 (90 Base) MCG/ACT inhaler Inhale 1-2 puffs into the lungs every 6 (six) hours as needed for wheezing or shortness of breath. 18 g Coral Spikes, DO     Controlled Substance Prescriptions [place] Controlled Substance Registry consulted? Not Applicable   Coral Spikes, DO 08/06/18 1522

## 2018-08-06 NOTE — ED Triage Notes (Signed)
Patient c/o cough x 6 months, She states on Monday she became fatigued and states her cough became worse, having shortness of breath and congestion.

## 2018-08-06 NOTE — Discharge Instructions (Signed)
Chest xray negative.  Inhaler as prescribed.  You will receive a call regarding COVID testing.

## 2018-08-07 ENCOUNTER — Other Ambulatory Visit: Payer: Managed Care, Other (non HMO)

## 2018-08-07 ENCOUNTER — Telehealth: Payer: Self-pay | Admitting: *Deleted

## 2018-08-07 DIAGNOSIS — Z20822 Contact with and (suspected) exposure to covid-19: Secondary | ICD-10-CM

## 2018-08-07 NOTE — Telephone Encounter (Signed)
-----   Message from Coral Spikes, DO sent at 08/06/2018  1:00 PM EDT ----- Regarding: COVID testing needed. Needs testing.  [facility]

## 2018-08-07 NOTE — Telephone Encounter (Signed)
Spoke with patient.  Scheduled her for COVID 19 test today at [facility] at 12:45 pm.  Testing protocol reviewed.

## 2018-08-12 LAB — NOVEL CORONAVIRUS, NAA: SARS-CoV-2, NAA: NOT DETECTED

## 2018-11-25 ENCOUNTER — Other Ambulatory Visit (HOSPITAL_COMMUNITY)
Admission: RE | Admit: 2018-11-25 | Discharge: 2018-11-25 | Disposition: A | Discharge status: Home / Self Care | Payer: Managed Care, Other (non HMO) | Source: Ambulatory Visit | Attending: Certified Nurse Midwife | Admitting: Certified Nurse Midwife

## 2018-11-25 ENCOUNTER — Other Ambulatory Visit: Payer: Self-pay

## 2018-11-25 ENCOUNTER — Ambulatory Visit (INDEPENDENT_AMBULATORY_CARE_PROVIDER_SITE_OTHER): Payer: Managed Care, Other (non HMO) | Admitting: Certified Nurse Midwife

## 2018-11-25 ENCOUNTER — Encounter: Payer: Self-pay | Admitting: Certified Nurse Midwife

## 2018-11-25 VITALS — BP 114/55 | HR 80 | Ht 62.0 in | Wt 214.1 lb

## 2018-11-25 DIAGNOSIS — Z202 Contact with and (suspected) exposure to infections with a predominantly sexual mode of transmission: Secondary | ICD-10-CM

## 2018-11-25 DIAGNOSIS — Z124 Encounter for screening for malignant neoplasm of cervix: Secondary | ICD-10-CM | POA: Insufficient documentation

## 2018-11-25 DIAGNOSIS — Z1231 Encounter for screening mammogram for malignant neoplasm of breast: Secondary | ICD-10-CM

## 2018-11-25 DIAGNOSIS — Z136 Encounter for screening for cardiovascular disorders: Secondary | ICD-10-CM

## 2018-11-25 DIAGNOSIS — Z01419 Encounter for gynecological examination (general) (routine) without abnormal findings: Secondary | ICD-10-CM | POA: Diagnosis not present

## 2018-11-25 DIAGNOSIS — Z13220 Encounter for screening for lipoid disorders: Secondary | ICD-10-CM

## 2018-11-25 MED ORDER — ALBUTEROL SULFATE HFA 108 (90 BASE) MCG/ACT IN AERS: 1.0000 | INHALATION_SPRAY | Freq: Four times a day (QID) | RESPIRATORY_TRACT | 0 refills | Status: DC | PRN

## 2018-11-25 NOTE — Progress Notes (Signed)
GYNECOLOGY ANNUAL PREVENTATIVE CARE ENCOUNTER NOTE  History:     Regina Schmidt is a 38 y.o. No obstetric history on file. female here for a routine annual gynecologic exam.  Current complaints: vaginal discomfort for the past week.   Denies abnormal vaginal bleeding, discharge, pelvic pain, problems with intercourse or other gynecologic concerns.     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 Contraception: tubal ligation Last Pap: It has been a while.  Results were: normal per pt Last mammogram: has not had one  Obstetric History OB History  Gravida Para Term Preterm AB Living  3 3 2 1   3   SAB TAB Ectopic Multiple Live Births          3    # Outcome Date GA Lbr Len/2nd Weight Sex Delivery Anes PTL Lv  3 Term 01/27/04 [redacted]w[redacted]d  7 lb 8 oz (3.402 kg) M CS-Unspec  Y LIV  2 Term 07/24/02 [redacted]w[redacted]d  7 lb (3.175 kg) F CS-Unspec  Y LIV  1 Preterm 12/02/00 [redacted]w[redacted]d  6 lb 5 oz (2.863 kg) F CS-Unspec  Y LIV    Past Medical History:  Diagnosis Date  . Diabetes mellitus without complication (HCC)    gestational    Past Surgical History:  Procedure Laterality Date  . CESAREAN SECTION    . TUBAL LIGATION      No current outpatient medications on file prior to visit.   No current facility-administered medications on file prior to visit.     No Known Allergies  Social History:  reports that she has never smoked. She has never used smokeless tobacco. She reports that she does not drink alcohol or use drugs.  Family History  Problem Relation Age of Onset  . Breast cancer Mother   . Diabetes Mother     The following portions of the patient's history were reviewed and updated as appropriate: allergies, current medications, past family history, past medical history, past social history, past surgical history and problem list.  Review of Systems Pertinent items noted  in HPI and remainder of comprehensive ROS otherwise negative.  Exercises 3 x wk for 20-30 min. Denies smoking, vaping, Marijuana, drug use and less than 1 x month alcohol use.   Physical Exam:  BP (!) 114/55   Pulse 80   Ht 5\' 2"  (1.575 m)   Wt 214 lb 1 oz (97.1 kg)   LMP 11/11/2018 (Exact Date)   BMI 39.15 kg/m  CONSTITUTIONAL: Well-developed, well-nourished female obese in no acute distress.  HENT:  Normocephalic, atraumatic, External right and left ear normal. Oropharynx is clear and moist EYES: Conjunctivae and EOM are normal. Pupils are equal, round, and reactive to light. No scleral icterus.  NECK: Normal range of motion, supple, no masses.  Normal thyroid.  SKIN: Skin is warm and dry. No rash noted. Not diaphoretic. No erythema. No pallor. MUSCULOSKELETAL: Normal range of motion. No tenderness.  No cyanosis, clubbing, or edema.  2+ distal pulses. NEUROLOGIC: Alert and oriented to person, place, and time. Normal reflexes, muscle tone coordination. No cranial nerve deficit noted. PSYCHIATRIC: Normal mood and affect. Normal behavior. Normal judgment and thought content. CARDIOVASCULAR: Normal heart rate noted, regular rhythm RESPIRATORY: Clear to auscultation bilaterally. Effort and breath sounds normal, no problems with respiration noted. BREASTS: Symmetric in size. No masses, skin changes, nipple drainage, or lymphadenopathy. ABDOMEN: Soft, normal bowel sounds,  no distention noted.  No tenderness, rebound or guarding.  PELVIC: Normal appearing external genitalia; 2 small ulcerative lesions on the posterior rim of vagina, tender to touch. normal appearing vaginal mucosa and cervix.  No abnormal discharge noted.  Pap smear obtained with Difficult to due to pt closing legs and lifting bottom , moving up the exam table.  Uterus difficult to assess due to body habiuts and pt not tolerating exam. No uterine or adnexal tenderness.   Assessment and Plan:    Annual [facility] Women GYN Exam . Will  follow up results of pap smear and manage accordingly. Mammogram scheduled Labs: STD testing today, vaginal swab collected, lipid profile Refills: albuterol inhaler  Routine preventative health maintenance measures emphasized. Please refer to After Visit Summary for other counseling recommendations.      Philip Aspen, CNM

## 2018-11-25 NOTE — Patient Instructions (Signed)

## 2018-11-27 LAB — HIV ANTIBODY (ROUTINE TESTING W REFLEX): HIV Screen 4th Generation wRfx: NONREACTIVE

## 2018-11-27 LAB — RPR: RPR Ser Ql: REACTIVE — AB

## 2018-11-27 LAB — HSV 1 AND 2 IGM ABS, INDIRECT
HSV 1 IgM: <1:10 {titer}
HSV 2 IgM: <1:10 {titer}

## 2018-11-27 LAB — LIPID PANEL
Chol/HDL Ratio: 4.1 ratio (ref 0.0–4.4)
Cholesterol, Total: 152 mg/dL (ref 100–199)
HDL: 37 mg/dL — ABNORMAL LOW (ref >39)
LDL Chol Calc (NIH): 101 mg/dL — ABNORMAL HIGH (ref 0–99)
Triglycerides: 69 mg/dL (ref 0–149)
VLDL Cholesterol Cal: 14 mg/dL (ref 5–40)

## 2018-11-27 LAB — RPR, QUANT+TP ABS (REFLEX)
Rapid Plasma Reagin, Quant: 1:1 {titer} — ABNORMAL HIGH
T Pallidum Abs: NONREACTIVE

## 2018-11-27 LAB — HEPATITIS B SURFACE ANTIGEN: Hepatitis B Surface Ag: NEGATIVE

## 2018-11-28 ENCOUNTER — Other Ambulatory Visit: Payer: Self-pay | Admitting: Certified Nurse Midwife

## 2018-11-28 LAB — CERVICOVAGINAL ANCILLARY ONLY
Bacterial Vaginitis (gardnerella): POSITIVE — AB
Candida Glabrata: NEGATIVE
Candida Vaginitis: NEGATIVE
Chlamydia: NEGATIVE
Comment: NEGATIVE
Comment: NEGATIVE
Comment: NEGATIVE
Comment: NEGATIVE
Comment: NEGATIVE
Comment: NORMAL
Neisseria Gonorrhea: NEGATIVE
Trichomonas: NEGATIVE

## 2018-11-28 MED ORDER — METRONIDAZOLE 500 MG PO TABS: 500.0000 mg | ORAL_TABLET | Freq: Two times a day (BID) | ORAL | 0 refills | Status: AC

## 2018-11-28 NOTE — Progress Notes (Signed)
Orders placed for treatment of BV. Vaginal swab positive. Pt notified via my chart.  Philip Aspen, CNM

## 2018-12-02 LAB — CYTOLOGY - PAP
Adequacy: ABSENT
Comment: NEGATIVE
Diagnosis: NEGATIVE
High risk HPV: NEGATIVE

## 2018-12-12 ENCOUNTER — Other Ambulatory Visit: Payer: Self-pay

## 2018-12-12 ENCOUNTER — Ambulatory Visit
Admission: EM | Admit: 2018-12-12 | Discharge: 2018-12-12 | Disposition: A | Discharge status: Home / Self Care | Payer: Managed Care, Other (non HMO) | Attending: Family Medicine | Admitting: Family Medicine

## 2018-12-12 ENCOUNTER — Encounter: Payer: Self-pay | Admitting: Emergency Medicine

## 2018-12-12 DIAGNOSIS — S29012A Strain of muscle and tendon of back wall of thorax, initial encounter: Secondary | ICD-10-CM

## 2018-12-12 DIAGNOSIS — M549 Dorsalgia, unspecified: Secondary | ICD-10-CM

## 2018-12-12 MED ORDER — NAPROXEN 500 MG PO TABS: 500.0000 mg | ORAL_TABLET | Freq: Two times a day (BID) | ORAL | 0 refills | Status: DC | PRN

## 2018-12-12 MED ORDER — CYCLOBENZAPRINE HCL 10 MG PO TABS: ORAL_TABLET | ORAL | 0 refills | Status: DC

## 2018-12-12 NOTE — ED Provider Notes (Signed)
[facility]    CSN: [id number] Arrival date & time: 12/12/18  [id number]      History   Chief Complaint Chief Complaint  Patient presents with  . Shoulder Pain    right  . Back Pain    HPI Regina Schmidt is a 38 y.o. female.   38 year old female presents with right sided upper back pain that started early this morning (4AM). No distinct injury or trauma but woke up with the pain. Increased pain with movement of right arm/back. Denies any fever, numbness, or radiation of pain. No difficulty breathing or anterior chest pain. Took a BC with some relief this morning. No previous injury or similar episodes. Other chronic health issues include occasional asthma and uses Albuterol inhaler as needed.   The history is provided by the patient.    Past Medical History:  Diagnosis Date  . Diabetes mellitus without complication (HCC)    gestational    There are no active problems to display for this patient.   Past Surgical History:  Procedure Laterality Date  . CESAREAN SECTION    . TUBAL LIGATION      OB History    Gravida  3   Para  3   Term  2   Preterm  1   AB      Living  3     SAB      TAB      Ectopic      Multiple      Live Births  3            Home Medications    Prior to Admission medications   Medication Sig Start Date End Date Taking? Authorizing Provider  albuterol (VENTOLIN HFA) 108 (90 Base) MCG/ACT inhaler Inhale 1-2 puffs into the lungs every 6 (six) hours as needed for wheezing or shortness of breath. 11/25/18  Yes Doreene Burkehompson, Annie, CNM  cyclobenzaprine (FLEXERIL) 10 MG tablet Take 1/2 tablet by mouth every 8 hours as needed for muscle spasms. May take 1 whole tablet at night as needed. 12/12/18   Sudie GrumblingAmyot, Hetty Linhart Berry, NP  naproxen (NAPROSYN) 500 MG tablet Take 1 tablet (500 mg total) by mouth 2 (two) times daily as needed for moderate pain. 12/12/18   Sudie GrumblingAmyot, Taesha Goodell Berry, NP    Family History Family History  Problem Relation Age  of Onset  . Breast cancer Mother   . Diabetes Mother     Social History Social History   Tobacco Use  . Smoking status: Never Smoker  . Smokeless tobacco: Never Used  Substance Use Topics  . Alcohol use: No  . Drug use: No     Allergies   Patient has no known allergies.   Review of Systems Review of Systems  Constitutional: Negative for activity change, appetite change, chills, fatigue and fever.  Eyes: Negative for photophobia and visual disturbance.  Respiratory: Negative for cough, chest tightness, shortness of breath and wheezing.   Cardiovascular: Negative for chest pain and palpitations.  Gastrointestinal: Negative for diarrhea, nausea and vomiting.  Musculoskeletal: Positive for back pain, myalgias and neck pain. Negative for arthralgias, gait problem, joint swelling and neck stiffness.  Skin: Negative for color change, rash and wound.  Allergic/Immunologic: Negative for environmental allergies, food allergies and immunocompromised state.  Neurological: Negative for dizziness, tremors, seizures, syncope, weakness, light-headedness, numbness and headaches.  Hematological: Negative for adenopathy. Does not bruise/bleed easily.     Physical Exam Triage Vital Signs ED Triage Vitals  Enc Vitals Group     BP 12/12/18 0930 138/86     Pulse Rate 12/12/18 0930 69     Resp 12/12/18 0930 16     Temp 12/12/18 0930 98.3 F (36.8 C)     Temp Source 12/12/18 0930 Oral     SpO2 12/12/18 0930 100 %     Weight 12/12/18 0927 207 lb (93.9 kg)     Height 12/12/18 0927 5\' 2"  (1.575 m)     Head Circumference --      Peak Flow --      Pain Score 12/12/18 0927 8     Pain Loc --      Pain Edu? --      Excl. in GC? --    No data found.  Updated Vital Signs BP 138/86 (BP Location: Left Arm)   Pulse 69   Temp 98.3 F (36.8 C) (Oral)   Resp 16   Ht 5\' 2"  (1.575 m)   Wt 207 lb (93.9 kg)   LMP 11/28/2018 (Approximate)   SpO2 100%   BMI 37.86 kg/m   Visual Acuity Right  Eye Distance:   Left Eye Distance:   Bilateral Distance:    Right Eye Near:   Left Eye Near:    Bilateral Near:     Physical Exam Vitals signs and nursing note reviewed.  Constitutional:      General: She is awake. She is not in acute distress.    Appearance: She is well-developed and well-groomed. She is not ill-appearing.     Comments: Patient sitting comfortably in exam chair in no acute distress.   HENT:     Head: Normocephalic and atraumatic.     Right Ear: External ear normal.     Left Ear: External ear normal.  Eyes:     Extraocular Movements: Extraocular movements intact.     Conjunctiva/sclera: Conjunctivae normal.     Pupils: Pupils are equal, round, and reactive to light.  Neck:     Musculoskeletal: Normal range of motion and neck supple. Normal range of motion. Pain with movement and muscular tenderness present. No neck rigidity or crepitus.      Comments: Has full range of motion of neck but some pain with rotation, particularly on right supraspinal and upper trapezius area. Slightly tender to palpation. Muscle tightness present. No redness or rash. No swelling. No radiation of pain. Good distal pulses and capillary refill. No neuro deficits noted.  Cardiovascular:     Rate and Rhythm: Normal rate and regular rhythm.     Pulses: Normal pulses.     Heart sounds: Normal heart sounds. No murmur.  Pulmonary:     Effort: Pulmonary effort is normal. No respiratory distress.     Breath sounds: Normal breath sounds and air entry. No stridor or decreased air movement. No decreased breath sounds, wheezing, rhonchi or rales.  Musculoskeletal:        General: Tenderness present. No swelling or signs of injury.     Cervical back: She exhibits tenderness, pain and spasm. She exhibits normal range of motion, no swelling and no edema.       Back:  Lymphadenopathy:     Cervical: No cervical adenopathy.  Skin:    General: Skin is warm and dry.     Capillary Refill: Capillary  refill takes less than 2 seconds.  Neurological:     General: No focal deficit present.     Mental Status: She is alert and oriented to person,  place, and time.     Sensory: Sensation is intact. No sensory deficit.     Motor: Motor function is intact.     Deep Tendon Reflexes: Reflexes are normal and symmetric.  Psychiatric:        Mood and Affect: Mood normal.        Behavior: Behavior normal. Behavior is cooperative.        Thought Content: Thought content normal.        Judgment: Judgment normal.      UC Treatments / Results  Labs (all labs ordered are listed, but only abnormal results are displayed) Labs Reviewed - No data to display  EKG   Radiology No results found.  Procedures Procedures (including critical care time)  Medications Ordered in UC Medications - No data to display  Initial Impression / Assessment and Plan / UC Course  I have reviewed the triage vital signs and the nursing notes.  Pertinent labs & imaging results that were available during my care of the patient were reviewed by me and considered in my medical decision making (see chart for details).    Reviewed with patient that she probably strained a muscle in the mid to upper right side of her back. Recommend start Naproxen 500mg  twice a day as needed. If pain and spasms still occur after taking Naproxen, may use Flexeril 10mg  1/2 tablet every 8 hours during the day and may take a whole tablet at night. Recommend use warm compresses to area for comfort. Note written for work for today. Follow-up in 3 to 4 days if not improving.   Final Clinical Impressions(s) / UC Diagnoses   Final diagnoses:  Upper back pain on right side  Muscle strain of right upper back, initial encounter     Discharge Instructions     Recommend start Naproxen 500mg  twice a day as needed for pain. May also take Flexeril 10mg  - take 1/2 tablet every 8 to 12 hours as needed. May take 1 whole tablet at night to help with pain  at night- may cause drowsiness. Apply warm compresses to area for comfort. Follow-up in 3 to 4 days if not improving.     ED Prescriptions    Medication Sig Dispense Auth. Provider   naproxen (NAPROSYN) 500 MG tablet Take 1 tablet (500 mg total) by mouth 2 (two) times daily as needed for moderate pain. 20 tablet Katy Apo, NP   cyclobenzaprine (FLEXERIL) 10 MG tablet Take 1/2 tablet by mouth every 8 hours as needed for muscle spasms. May take 1 whole tablet at night as needed. 15 tablet Akeen Ledyard, Nicholes Stairs, NP     PDMP not reviewed this encounter.   Katy Apo, NP 12/12/18 1753

## 2018-12-12 NOTE — Discharge Instructions (Addendum)
Recommend start Naproxen 500mg  twice a day as needed for pain. May also take Flexeril 10mg  - take 1/2 tablet every 8 to 12 hours as needed. May take 1 whole tablet at night to help with pain at night- may cause drowsiness. Apply warm compresses to area for comfort. Follow-up in 3 to 4 days if not improving.

## 2018-12-12 NOTE — ED Triage Notes (Signed)
Patient states that early this morning while she was getting dressed she noticed that she was having pain in her right shoulder and right upper back.  Patient states that when raises her right arm it makes her pain worse.  Patient denies nay fall or specific injury.

## 2019-08-24 ENCOUNTER — Telehealth: Payer: Self-pay | Admitting: Certified Nurse Midwife

## 2019-08-24 ENCOUNTER — Other Ambulatory Visit: Payer: Self-pay | Admitting: Certified Nurse Midwife

## 2019-08-24 MED ORDER — FLUCONAZOLE 150 MG PO TABS: 150.0000 mg | ORAL_TABLET | Freq: Once | ORAL | 0 refills | Status: AC

## 2019-08-24 MED ORDER — ALBUTEROL SULFATE HFA 108 (90 BASE) MCG/ACT IN AERS: 1.0000 | INHALATION_SPRAY | Freq: Four times a day (QID) | RESPIRATORY_TRACT | 0 refills | Status: DC | PRN

## 2019-08-24 NOTE — Telephone Encounter (Signed)
I put in the orders for her. Please let her know.   Thanks  Doreene Burke, CNM

## 2019-08-24 NOTE — Telephone Encounter (Signed)
LM with patients daughter that I was returning patients call.

## 2019-08-24 NOTE — Telephone Encounter (Signed)
Please advise 

## 2019-08-24 NOTE — Telephone Encounter (Signed)
Patient would like to be prescribed a medication for yeast infection. Patient is stated she is having some vaginal itchiness and states she can see yeast. Patient would like refill on her albuterol as well. Could you please advise?

## 2019-08-24 NOTE — Progress Notes (Signed)
Pt request refill on her inhaler orders placed. Also request medication for yeast. Orders placed.   Doreene Burke, CNM

## 2019-09-07 ENCOUNTER — Telehealth: Payer: Self-pay

## 2019-09-07 ENCOUNTER — Telehealth: Payer: Managed Care, Other (non HMO) | Admitting: Family

## 2019-09-07 DIAGNOSIS — H9201 Otalgia, right ear: Secondary | ICD-10-CM

## 2019-09-07 NOTE — Telephone Encounter (Signed)
Patient called this morning stating she would like to have some ear drops sent for her right ear. Patient stated she had a eVisit today and was told to contact her PCP. I looked over note the recommendation to follow was for her to be seen at her PCP for further evaluation  as her may be needed to be flushed. I asked patient if she had a PCP and she stated Regina Schmidt has always been her PCP. I am unsure how you guys handle can you please advise patient.

## 2019-09-07 NOTE — Progress Notes (Signed)
Based on what you shared with me, I feel your condition warrants further evaluation and I recommend that you be seen for a face to face visit.  Please contact your primary care physician practice to be seen. Many offices offer virtual options to be seen via video if you are not comfortable going in person to a medical facility at this time.  It sounds like your ear may need to be flushed out.   If you do not have a PCP, [facility] offers a free physician referral service available at [phone]. Our trained staff has the experience, knowledge and resources to put you in touch with a physician who is right for you.   You also have the option of a video visit through [email or web address]  If you are having a true medical emergency please call 911.  NOTE: If you entered your credit card information for this eVisit, you will not be charged. You may see a "hold" on your card for the $35 but that hold will drop off and you will not have a charge processed.  Your e-visit answers were reviewed by a board certified advanced clinical practitioner to complete your personal care plan.  Thank you for using e-Visits.

## 2019-09-07 NOTE — Telephone Encounter (Signed)
mychart message sent to patient to please contact PCP we are OB/GYN and do not treat ears.

## 2019-09-12 ENCOUNTER — Encounter: Payer: Self-pay | Admitting: Gynecology

## 2019-09-12 ENCOUNTER — Ambulatory Visit
Admission: EM | Admit: 2019-09-12 | Discharge: 2019-09-12 | Disposition: A | Discharge status: Home / Self Care | Payer: Managed Care, Other (non HMO) | Attending: Family | Admitting: Family

## 2019-09-12 ENCOUNTER — Other Ambulatory Visit: Payer: Self-pay

## 2019-09-12 DIAGNOSIS — H9202 Otalgia, left ear: Secondary | ICD-10-CM | POA: Diagnosis not present

## 2019-09-12 DIAGNOSIS — H6123 Impacted cerumen, bilateral: Secondary | ICD-10-CM | POA: Diagnosis not present

## 2019-09-12 NOTE — Discharge Instructions (Addendum)
We flushed out both ears and removed the ear wax. Ear drum and ear canal are normal with no infection. Continue to monitor ears. Follow-up as needed.

## 2019-09-12 NOTE — ED Provider Notes (Signed)
[facility]    CSN: [id number] Arrival date & time: 09/12/19  1104      History   Chief Complaint Chief Complaint  Patient presents with   Otalgia    HPI Regina Schmidt is a 39 y.o. female.   39 year old female presents with left ear pain that has been occurring for the past week. Ear feels "dry" and itchy. Right ear with slight irritation but no pain. Also having slight runny nose and cough but denies any fever, dizziness, cough or GI symptoms. Has tried using a Q-tip in her ear with minimal success. Has not tried any ear drops. No other chronic health issues except occasional mild asthma symptoms and uses Albuterol prn.   The history is provided by the patient.    Past Medical History:  Diagnosis Date   Diabetes mellitus without complication (HCC)    gestational    There are no problems to display for this patient.   Past Surgical History:  Procedure Laterality Date   CESAREAN SECTION     TUBAL LIGATION      OB History    Gravida  3   Para  3   Term  2   Preterm  1   AB      Living  3     SAB      TAB      Ectopic      Multiple      Live Births  3            Home Medications    Prior to Admission medications   Medication Sig Start Date End Date Taking? Authorizing Provider  albuterol (VENTOLIN HFA) 108 (90 Base) MCG/ACT inhaler Inhale 1-2 puffs into the lungs every 6 (six) hours as needed for wheezing or shortness of breath. 08/24/19  Yes Doreene Burke, CNM    Family History Family History  Problem Relation Age of Onset   Breast cancer Mother    Diabetes Mother     Social History Social History   Tobacco Use   Smoking status: Never Smoker   Smokeless tobacco: Never Used  Building services engineer Use: Never used  Substance Use Topics   Alcohol use: No   Drug use: No     Allergies   Patient has no known allergies.   Review of Systems Review of Systems  Constitutional: Negative for activity  change, appetite change, chills, diaphoresis, fatigue and fever.  HENT: Positive for ear pain (mainly left), postnasal drip and rhinorrhea. Negative for congestion, ear discharge, facial swelling, mouth sores, nosebleeds, sinus pressure, sinus pain, sore throat, tinnitus and trouble swallowing.   Eyes: Negative for pain, discharge, redness and itching.  Respiratory: Negative for cough, chest tightness and shortness of breath.   Gastrointestinal: Negative for diarrhea, nausea and vomiting.  Musculoskeletal: Negative for arthralgias, myalgias, neck pain and neck stiffness.  Skin: Negative for color change, rash and wound.  Allergic/Immunologic: Negative for food allergies and immunocompromised state.  Neurological: Negative for dizziness, seizures, syncope, facial asymmetry, weakness, light-headedness, numbness and headaches.  Hematological: Negative for adenopathy. Does not bruise/bleed easily.     Physical Exam Triage Vital Signs ED Triage Vitals  Enc Vitals Group     BP 09/12/19 1124 133/74     Pulse Rate 09/12/19 1124 70     Resp 09/12/19 1124 16     Temp 09/12/19 1124 98.3 F (36.8 C)     Temp Source 09/12/19 1124 Oral  SpO2 09/12/19 1124 100 %     Weight 09/12/19 1121 212 lb (96.2 kg)     Height 09/12/19 1121 5\' 2"  (1.575 m)     Head Circumference --      Peak Flow --      Pain Score 09/12/19 1121 6     Pain Loc --      Pain Edu? --      Excl. in GC? --    No data found.  Updated Vital Signs BP 133/74 (BP Location: Left Arm)    Pulse 70    Temp 98.3 F (36.8 C) (Oral)    Resp 16    Ht 5\' 2"  (1.575 m)    Wt 212 lb (96.2 kg)    LMP 09/02/2019    SpO2 100%    BMI 38.78 kg/m   Visual Acuity Right Eye Distance:   Left Eye Distance:   Bilateral Distance:    Right Eye Near:   Left Eye Near:    Bilateral Near:     Physical Exam Vitals and nursing note reviewed.  Constitutional:      General: She is awake. She is not in acute distress.    Appearance: She is  well-developed and well-groomed. She is not ill-appearing.     Comments: Patient sitting comfortably on the exam table in no acute distress.   HENT:     Head: Normocephalic and atraumatic.     Right Ear: External ear normal. Decreased hearing noted. There is impacted cerumen.     Left Ear: External ear normal. Decreased hearing noted. Tenderness present. No drainage or swelling. There is impacted cerumen.     Ears:     Comments: Dark yellow to brown cerumen present in left ear canal completely blocking TM.  Dark brown hard cerumen present in right ear mostly blocking TM.      Nose: Nose normal. No congestion.     Right Sinus: No maxillary sinus tenderness or frontal sinus tenderness.     Left Sinus: No maxillary sinus tenderness or frontal sinus tenderness.     Mouth/Throat:     Lips: Pink.     Mouth: Mucous membranes are moist.     Pharynx: Oropharynx is clear. Uvula midline. No pharyngeal swelling or posterior oropharyngeal erythema.  Eyes:     Extraocular Movements: Extraocular movements intact.     Conjunctiva/sclera: Conjunctivae normal.  Cardiovascular:     Rate and Rhythm: Normal rate and regular rhythm.     Heart sounds: Normal heart sounds. No murmur heard.   Pulmonary:     Effort: Pulmonary effort is normal. No respiratory distress.     Breath sounds: Normal breath sounds and air entry. No decreased air movement. No decreased breath sounds, wheezing or rhonchi.  Musculoskeletal:     Cervical back: Normal range of motion and neck supple. No rigidity.  Lymphadenopathy:     Cervical: No cervical adenopathy.  Skin:    General: Skin is warm and dry.     Capillary Refill: Capillary refill takes less than 2 seconds.     Findings: No rash.  Neurological:     General: No focal deficit present.     Mental Status: She is alert and oriented to person, place, and time.  Psychiatric:        Mood and Affect: Mood normal.        Behavior: Behavior normal. Behavior is cooperative.         Thought Content: Thought content normal.  Judgment: Judgment normal.      UC Treatments / Results  Labs (all labs ordered are listed, but only abnormal results are displayed) Labs Reviewed - No data to display  EKG   Radiology No results found.  Procedures Procedures (including critical care time)  Medications Ordered in UC Medications - No data to display  Initial Impression / Assessment and Plan / UC Course  I have reviewed the triage vital signs and the nursing notes.  Pertinent labs & imaging results that were available during my care of the patient were reviewed by me and considered in my medical decision making (see chart for details).    Reviewed with patient that both ear canals are blocked with ear wax. Nurse performed ear irrigation for both ears with success. Needed to use Colace to help loosen up wax for removal and took multiple tries to remove cerumen.  After irrigation, both ear canals were clear with no redness or discharge. Both TM's were normal with no redness or fluid. Patient indicated that pain had resolved and she could hear better.  Recommend not using Q-tips in her ears. Continue to monitor symptoms and follow-up as needed.  Final Clinical Impressions(s) / UC Diagnoses   Final diagnoses:  Bilateral impacted cerumen  Acute pain of left ear     Discharge Instructions     We flushed out both ears and removed the ear wax. Ear drum and ear canal are normal with no infection. Continue to monitor ears. Follow-up as needed.     ED Prescriptions    None     PDMP not reviewed this encounter.   Sudie Grumbling, NP 09/14/19 0004

## 2019-09-12 NOTE — ED Triage Notes (Signed)
Patient c/o left ear pain x 1 week. 

## 2019-09-28 ENCOUNTER — Telehealth: Payer: Self-pay | Admitting: Certified Nurse Midwife

## 2019-09-28 NOTE — Telephone Encounter (Signed)
error 

## 2019-11-30 ENCOUNTER — Encounter: Payer: Managed Care, Other (non HMO) | Admitting: Certified Nurse Midwife

## 2019-12-23 ENCOUNTER — Ambulatory Visit (INDEPENDENT_AMBULATORY_CARE_PROVIDER_SITE_OTHER): Payer: Managed Care, Other (non HMO) | Admitting: Adult Health

## 2019-12-23 DIAGNOSIS — Z5329 Procedure and treatment not carried out because of patient's decision for other reasons: Secondary | ICD-10-CM

## 2019-12-23 DIAGNOSIS — Z91199 Patient's noncompliance with other medical treatment and regimen due to unspecified reason: Secondary | ICD-10-CM | POA: Insufficient documentation

## 2019-12-23 NOTE — Patient Instructions (Signed)
   NO show for appointment x 1 12/23/19. No patient contact.  

## 2019-12-23 NOTE — Progress Notes (Signed)
    NO show for appointment x 1 12/23/19. No patient contact.

## 2020-10-06 ENCOUNTER — Telehealth: Payer: Managed Care, Other (non HMO) | Admitting: Certified Nurse Midwife

## 2020-10-06 NOTE — Telephone Encounter (Signed)
Patient called and stated that she has a yeast infection.  Patient is requesting a prescription of Diflucan

## 2020-10-07 MED ORDER — FLUCONAZOLE 150 MG PO TABS: 150.0000 mg | ORAL_TABLET | Freq: Once | ORAL | 0 refills | Status: AC

## 2020-10-07 NOTE — Telephone Encounter (Signed)
Pt is aware that her medication has been sent to the pharmacy. Pt was advised that after 7 days if she was still having symptoms to please contact the office and schedule an appointment with Pattricia Boss. Pt voiced that she understood.

## 2021-02-20 IMAGING — CR CHEST - 2 VIEW
2 series · 2 of 2 positions shown · non-contrast
Comparison: None.

CLINICAL DATA: Cough for 6 months.

EXAM:
CHEST - 2 VIEW

[chest pa]
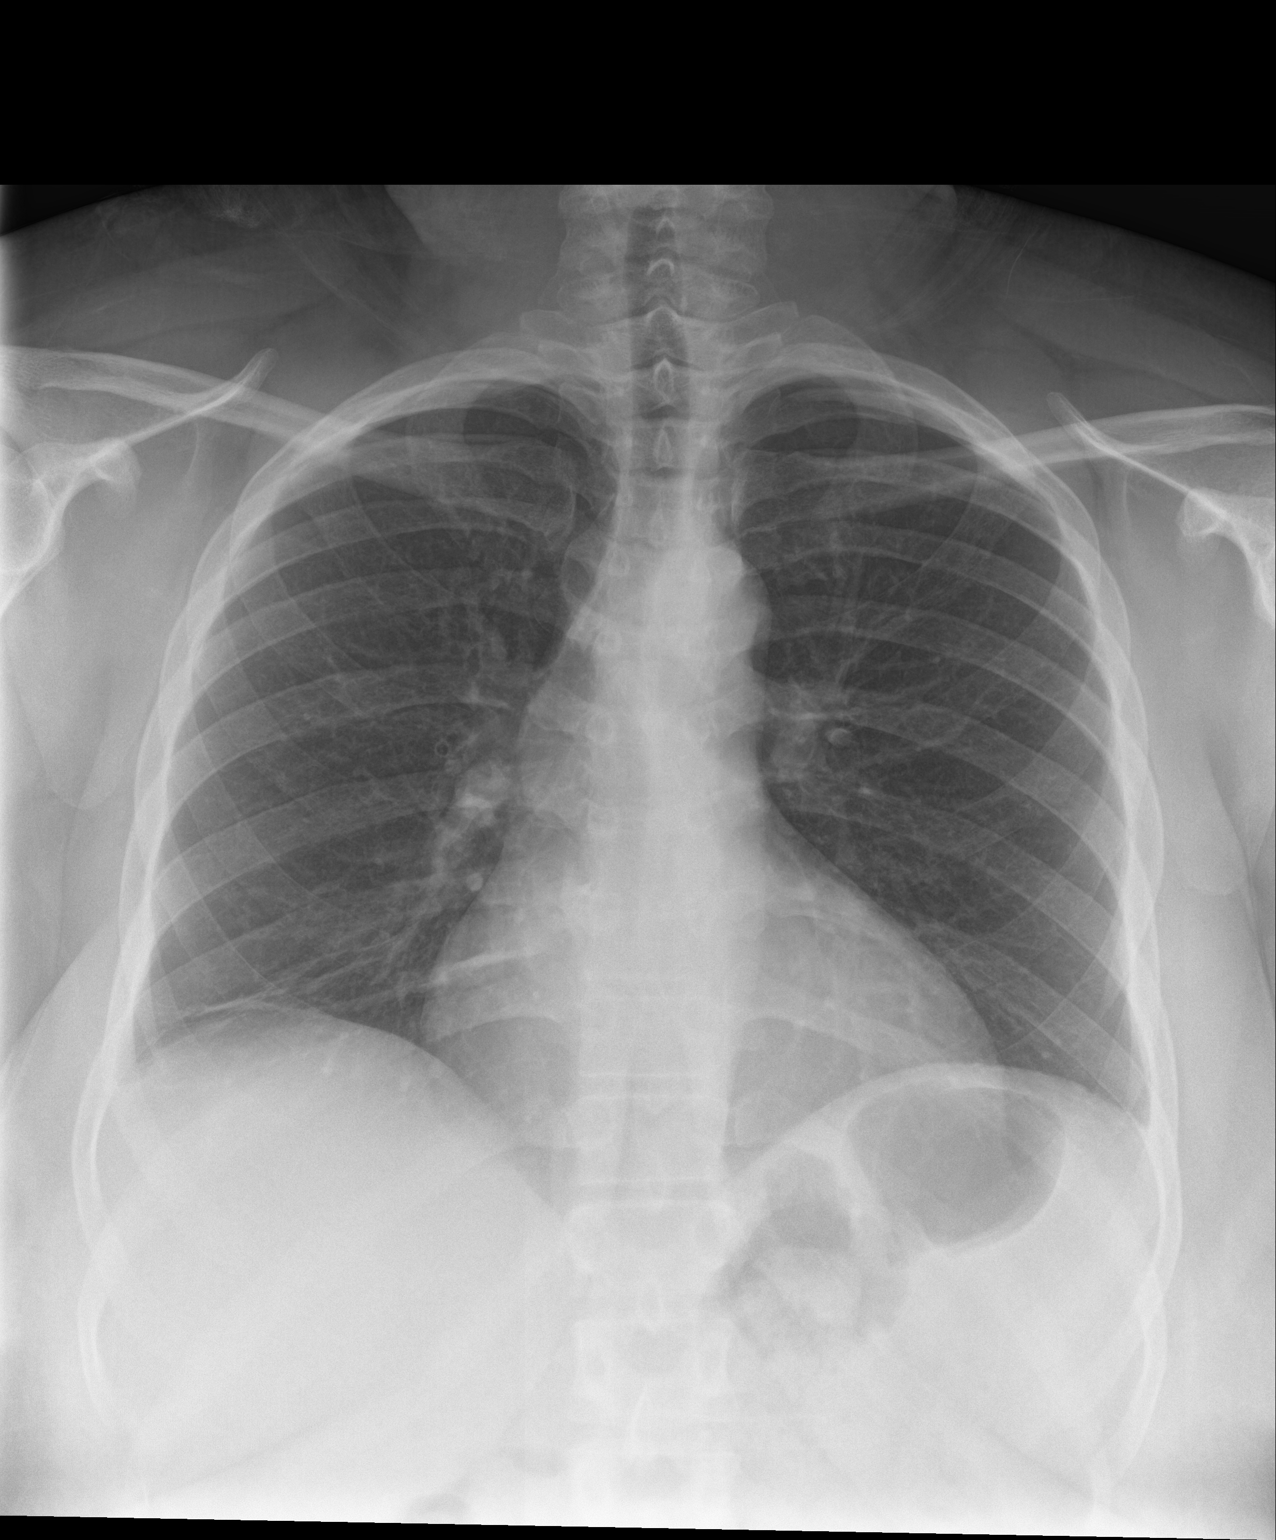

[chest lat]
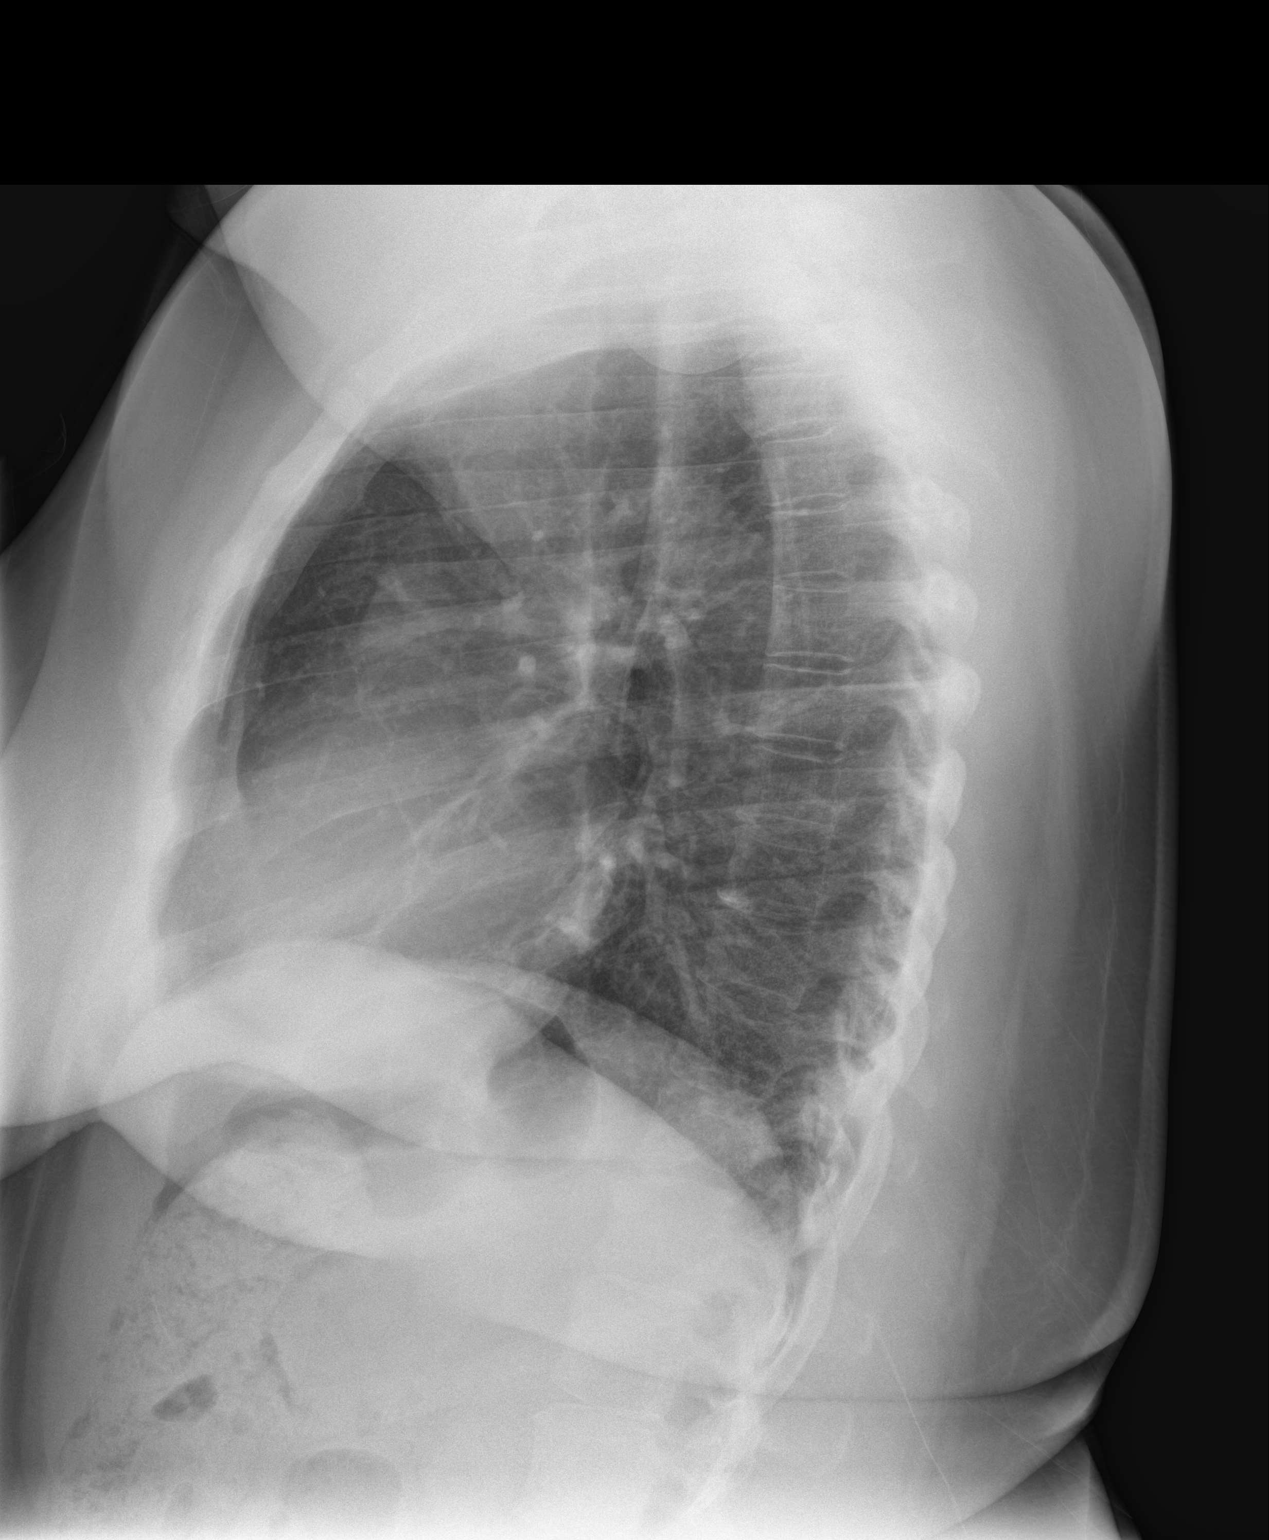

[2 of 2 positions shown; findings below may reference images not displayed]

FINDINGS: The heart size and mediastinal contours are within normal limits.
There is mild linear atelectasis of right lung base. There is no
focal infiltrate, pulmonary edema, or pleural effusion. The
visualized skeletal structures are unremarkable.
IMPRESSION: No focal pneumonia or pulmonary edema. Minimal atelectasis of right
lung base.

## 2021-04-26 ENCOUNTER — Telehealth: Payer: Self-pay | Admitting: Certified Nurse Midwife

## 2021-04-26 NOTE — Telephone Encounter (Signed)
Pt called stating that she thinks she has a UTI - has been having freq. urges to urinate- no burning been going on for about 3 days now. Please advise  ? ?Confirmed pharmacy as [facility] on [address].  ?

## 2021-04-26 NOTE — Telephone Encounter (Signed)
Pt scheduled for an apt with provider ?

## 2021-05-03 ENCOUNTER — Encounter: Payer: Managed Care, Other (non HMO) | Admitting: Certified Nurse Midwife

## 2021-05-03 ENCOUNTER — Other Ambulatory Visit: Payer: Self-pay

## 2021-05-03 ENCOUNTER — Ambulatory Visit
Admission: EM | Admit: 2021-05-03 | Discharge: 2021-05-03 | Disposition: A | Discharge status: Home / Self Care | Payer: Managed Care, Other (non HMO) | Attending: Physician Assistant | Admitting: Physician Assistant

## 2021-05-03 ENCOUNTER — Encounter: Payer: Self-pay | Admitting: Emergency Medicine

## 2021-05-03 DIAGNOSIS — N760 Acute vaginitis: Secondary | ICD-10-CM

## 2021-05-03 DIAGNOSIS — B9689 Other specified bacterial agents as the cause of diseases classified elsewhere: Secondary | ICD-10-CM | POA: Diagnosis present

## 2021-05-03 LAB — WET PREP, GENITAL
Sperm: NONE SEEN
Trich, Wet Prep: NONE SEEN
WBC, Wet Prep HPF POC: <10 — AB (ref <10)
Yeast Wet Prep HPF POC: NONE SEEN

## 2021-05-03 LAB — URINALYSIS, ROUTINE W REFLEX MICROSCOPIC
Bilirubin Urine: NEGATIVE
Glucose, UA: NEGATIVE mg/dL
Nitrite: NEGATIVE
Protein, ur: >300 mg/dL — AB
Specific Gravity, Urine: >1.03 — ABNORMAL HIGH (ref 1.005–1.030)
pH: 5.5 (ref 5.0–8.0)

## 2021-05-03 LAB — URINALYSIS, MICROSCOPIC (REFLEX)

## 2021-05-03 MED ORDER — METRONIDAZOLE 500 MG PO TABS: 500.0000 mg | ORAL_TABLET | Freq: Two times a day (BID) | ORAL | 0 refills | Status: DC

## 2021-05-03 MED ORDER — ALBUTEROL SULFATE HFA 108 (90 BASE) MCG/ACT IN AERS: 1.0000 | INHALATION_SPRAY | Freq: Four times a day (QID) | RESPIRATORY_TRACT | 0 refills | Status: AC | PRN

## 2021-05-03 NOTE — ED Triage Notes (Signed)
Pt c/o urinary frequency, dysuria. Started about a week ago. Denies vaginal discharge.  ?

## 2021-05-03 NOTE — Discharge Instructions (Addendum)
Today you are being treated  for  Bacterial vaginosis  ? ?Urinalysis was negative for infection however it has been sent to the lab to determine if any bacteria grow, if this occurs you will be notified and additional antibiotics sent to the pharmacy ? ?Wet prep was negative for yeast or trichomoniasis ? ?Take Metronidazole 500 mg twice a day for 7 days, do not drink alcohol while using medication, this will make you feel sick  ? ?Bacterial vaginosis which results from an overgrowth of one on several organisms that are normally present in your vagina. Vaginosis is an inflammation of the vagina that can result in discharge, itching and pain. ? ?Labs pending 2-3 days, you will be contacted if positive for any sti and treatment will be sent to the pharmacy, you will have to return to the clinic if positive for gonorrhea to receive treatment  ? ?Please refrain from having sex until labs results, if positive please refrain from having sex until treatment complete and symptoms resolve  ? ?If positive for HIV, Syphilis, Chlamydia  gonorrhea or trichomoniasis please notify partner or partners so they may tested as well ? ?Moving forward, it is recommended you use some form of protection against the transmission of sti infections  such as condoms or dental dams with each sexual encounter   ? ? ?In addition: ?Avoid baths, hot tubs and whirlpool spas.  ?Don't use scented or harsh soaps ?Avoid irritants. These include scented tampons and pads. ?Wipe from front to back after using the toilet. ?Don't douche. Your vagina doesn't require cleansing other than normal bathing.  ?Use a condom.  ?Wear cotton underwear, this fabric absorbs some moisture.  ? ? ?  ?

## 2021-05-03 NOTE — ED Provider Notes (Signed)
?[facility] ? ? ? ?CSN: [id number] ?Arrival date & time: 05/03/21  1456 ? ? ?  ? ?History   ?Chief Complaint ?Chief Complaint  ?Patient presents with  ? Dysuria  ? ? ?HPI ?Regina Schmidt is a 41 y.o. female.  ? ?Patient presents with urinary frequency and dysuria for 1 week .  Has not attempted treatment of symptoms.  Denies lower abdominal pain or pressure, flank pain, hematuria, urgency, vaginal discharge, itching or odor, fever or chills.  ? ?Past Medical History:  ?Diagnosis Date  ? Diabetes mellitus without complication (HCC)   ? gestational  ? ? ?Patient Active Problem List  ? Diagnosis Date Noted  ? No-show for appointment 12/23/2019  ? ? ?Past Surgical History:  ?Procedure Laterality Date  ? CESAREAN SECTION    ? TUBAL LIGATION    ? ? ?OB History   ? ? Gravida  ?3  ? Para  ?3  ? Term  ?2  ? Preterm  ?1  ? AB  ?   ? Living  ?3  ?  ? ? SAB  ?   ? IAB  ?   ? Ectopic  ?   ? Multiple  ?   ? Live Births  ?3  ?   ?  ?  ? ? ? ?Home Medications   ? ?Prior to Admission medications   ?Medication Sig Start Date End Date Taking? Authorizing Provider  ?albuterol (VENTOLIN HFA) 108 (90 Base) MCG/ACT inhaler Inhale 1-2 puffs into the lungs every 6 (six) hours as needed for wheezing or shortness of breath. 08/24/19   Doreene Burke, CNM  ? ? ?Family History ?Family History  ?Problem Relation Age of Onset  ? Breast cancer Mother   ? Diabetes Mother   ? ? ?Social History ?Social History  ? ?Tobacco Use  ? Smoking status: Never  ? Smokeless tobacco: Never  ?Vaping Use  ? Vaping Use: Never used  ?Substance Use Topics  ? Alcohol use: No  ? Drug use: No  ? ? ? ?Allergies   ?Patient has no known allergies. ? ? ?Review of Systems ?Review of Systems  ?Constitutional: Negative.   ?Eyes:  Negative for photophobia, pain, discharge, redness, itching and visual disturbance.  ?Respiratory: Negative.    ?Cardiovascular: Negative.   ?Genitourinary:  Positive for dysuria and frequency. Negative for decreased urine volume,  difficulty urinating, dyspareunia, enuresis, flank pain, genital sores, hematuria, menstrual problem, pelvic pain, urgency, vaginal bleeding, vaginal discharge and vaginal pain.  ? ? ?Physical Exam ?Triage Vital Signs ?ED Triage Vitals  ?Enc Vitals Group  ?   BP 05/03/21 1508 (!) 143/93  ?   Pulse Rate 05/03/21 1508 74  ?   Resp 05/03/21 1508 18  ?   Temp 05/03/21 1508 98.2 ?F (36.8 ?C)  ?   Temp Source 05/03/21 1508 Oral  ?   SpO2 05/03/21 1508 100 %  ?   Weight 05/03/21 1506 212 lb 1.3 oz (96.2 kg)  ?   Height 05/03/21 1506 5\' 2"  (1.575 m)  ?   Head Circumference --   ?   Peak Flow --   ?   Pain Score 05/03/21 1505 7  ?   Pain Loc --   ?   Pain Edu? --   ?   Excl. in GC? --   ? ?No data found. ? ?Updated Vital Signs ?BP (!) 143/93 (BP Location: Right Arm)   Pulse 74   Temp 98.2 ?F (36.8 ?C) (Oral)  Resp 18   Ht 5\' 2"  (1.575 m)   Wt 212 lb 1.3 oz (96.2 kg)   LMP 04/25/2021 (Approximate)   SpO2 100%   BMI 38.79 kg/m?  ? ?Visual Acuity ?Right Eye Distance:   ?Left Eye Distance:   ?Bilateral Distance:   ? ?Right Eye Near:   ?Left Eye Near:    ?Bilateral Near:    ? ?Physical Exam ?Constitutional:   ?   Appearance: Normal appearance.  ?HENT:  ?   Head: Normocephalic.  ?Eyes:  ?   Extraocular Movements: Extraocular movements intact.  ?Pulmonary:  ?   Effort: Pulmonary effort is normal.  ?Abdominal:  ?   General: Abdomen is flat. Bowel sounds are normal. There is no distension.  ?   Palpations: Abdomen is soft.  ?   Tenderness: There is no abdominal tenderness.  ?Genitourinary: ?   Comments: Deferred, self collect vaginal swab ?Neurological:  ?   Mental Status: She is alert and oriented to person, place, and time. Mental status is at baseline.  ?Psychiatric:     ?   Mood and Affect: Mood normal.     ?   Behavior: Behavior normal.  ? ? ? ?UC Treatments / Results  ?Labs ?(all labs ordered are listed, but only abnormal results are displayed) ?Labs Reviewed  ?URINALYSIS, ROUTINE W REFLEX MICROSCOPIC   ? ? ?EKG ? ? ?Radiology ?No results found. ? ?Procedures ?Procedures (including critical care time) ? ?Medications Ordered in UC ?Medications - No data to display ? ?Initial Impression / Assessment and Plan / UC Course  ?I have reviewed the triage vital signs and the nursing notes. ? ?Pertinent labs & imaging results that were available during my care of the patient were reviewed by me and considered in my medical decision making (see chart for details). ? ?Bacterial vaginosis ? ?Confirmed by wet prep, negative for yeast and trichomoniasis, urinalysis negative, sent for culture, discussed all lab results with patient, discussed etiology of symptoms , metronidazole 7-day course prescribed, advise discontinuation of any alcohol use while taking medication advised abstinence until treatment is complete and symptoms have resolved, advised condom use during all sexual encounters moving forward follow-up with urgent care as needed ?Final Clinical Impressions(s) / UC Diagnoses  ? ?Final diagnoses:  ?None  ? ?Discharge Instructions   ?None ?  ? ?ED Prescriptions   ?None ?  ? ?PDMP not reviewed this encounter. ?  ?04/27/2021, NP ?05/03/21 1621 ? ?

## 2021-05-06 LAB — URINE CULTURE: Culture: >=100000 — AB

## 2021-05-08 ENCOUNTER — Telehealth (HOSPITAL_COMMUNITY): Payer: Self-pay | Admitting: Emergency Medicine

## 2021-05-08 MED ORDER — NITROFURANTOIN MONOHYD MACRO 100 MG PO CAPS: 100.0000 mg | ORAL_CAPSULE | Freq: Two times a day (BID) | ORAL | 0 refills | Status: DC

## 2021-07-11 ENCOUNTER — Inpatient Hospital Stay
Admission: RE | Admit: 2021-07-11 | Discharge: 2021-07-11 | Disposition: A | Discharge status: Home / Self Care | Payer: No Typology Code available for payment source | Source: Ambulatory Visit | Attending: Nurse Practitioner | Admitting: Nurse Practitioner

## 2021-10-05 ENCOUNTER — Ambulatory Visit (INDEPENDENT_AMBULATORY_CARE_PROVIDER_SITE_OTHER): Payer: BC Managed Care – PPO | Admitting: Nurse Practitioner

## 2021-10-05 ENCOUNTER — Encounter: Payer: Self-pay | Admitting: Nurse Practitioner

## 2021-10-05 VITALS — BP 124/80 | HR 87 | Ht 62.0 in | Wt 197.4 lb

## 2021-10-05 DIAGNOSIS — E669 Obesity, unspecified: Secondary | ICD-10-CM | POA: Diagnosis not present

## 2021-10-05 DIAGNOSIS — Z1231 Encounter for screening mammogram for malignant neoplasm of breast: Secondary | ICD-10-CM | POA: Diagnosis not present

## 2021-10-05 DIAGNOSIS — Z6836 Body mass index (BMI) 36.0-36.9, adult: Secondary | ICD-10-CM

## 2021-10-05 DIAGNOSIS — R058 Other specified cough: Secondary | ICD-10-CM | POA: Diagnosis not present

## 2021-10-05 MED ORDER — LORATADINE 10 MG PO TABS: 10.0000 mg | ORAL_TABLET | Freq: Every day | ORAL | 11 refills | Status: AC

## 2021-10-05 MED ORDER — BENZONATATE 100 MG PO CAPS: 100.0000 mg | ORAL_CAPSULE | Freq: Two times a day (BID) | ORAL | 0 refills | Status: AC | PRN

## 2021-10-05 NOTE — Progress Notes (Signed)
Established Patient Office Visit  Subjective:  Patient ID: Regina Schmidt, female    DOB: 03-Oct-1980  Age: 41 y.o. MRN: [id number]  CC:  Chief Complaint  Patient presents with   New Patient (Initial Visit)     HPI  Regina Schmidt presents to establish care. She has history of gestational diabetes. She has chronic dry cough going on from 2 year. She has tried OTC medication with minimal relief.   HPI   Past Medical History:  Diagnosis Date   Diabetes mellitus without complication (HCC)    gestational    Past Surgical History:  Procedure Laterality Date   CESAREAN SECTION     TUBAL LIGATION      Family History  Problem Relation Age of Onset   Hypertension Mother    Breast cancer Mother    Diabetes Father     Social History   Socioeconomic History   Marital status: Single    Spouse name: Not on file   Number of children: Not on file   Years of education: Not on file   Highest education level: Not on file  Occupational History   Not on file  Tobacco Use   Smoking status: Never   Smokeless tobacco: Never  Vaping Use   Vaping Use: Never used  Substance and Sexual Activity   Alcohol use: No   Drug use: No   Sexual activity: Yes    Birth control/protection: Surgical  Other Topics Concern   Not on file  Social History Narrative   Not on file   Social Determinants of Health   Financial Resource Strain: Not on file  Food Insecurity: Not on file  Transportation Needs: Not on file  Physical Activity: Not on file  Stress: Not on file  Social Connections: Not on file  Intimate Partner Violence: Not on file     Outpatient Medications Prior to Visit  Medication Sig Dispense Refill   albuterol (VENTOLIN HFA) 108 (90 Base) MCG/ACT inhaler Inhale 1-2 puffs into the lungs every 6 (six) hours as needed for wheezing or shortness of breath. 18 g 0   metroNIDAZOLE (FLAGYL) 500 MG tablet Take 1 tablet (500 mg total) by mouth 2 (two) times daily. 14 tablet 0    nitrofurantoin, macrocrystal-monohydrate, (MACROBID) 100 MG capsule Take 1 capsule (100 mg total) by mouth 2 (two) times daily. 10 capsule 0   No facility-administered medications prior to visit.    Allergies  Allergen Reactions   Percocet [Oxycodone-Acetaminophen]     ROS Review of Systems  Constitutional: Negative.   HENT: Negative.    Eyes: Negative.   Respiratory:  Negative for chest tightness and shortness of breath.   Cardiovascular:  Negative for chest pain and palpitations.  Gastrointestinal: Negative.   Genitourinary: Negative.   Musculoskeletal:  Negative for arthralgias and myalgias.  Skin:  Negative for color change.  Neurological:  Negative for dizziness, facial asymmetry and headaches.  Psychiatric/Behavioral:  Negative for agitation, behavioral problems and confusion.       Objective:    Physical Exam Constitutional:      Appearance: Normal appearance. She is obese.  HENT:     Head: Normocephalic.     Right Ear: Tympanic membrane normal.     Left Ear: Tympanic membrane normal.     Nose: Nose normal.     Mouth/Throat:     Mouth: Mucous membranes are moist.     Pharynx: Oropharynx is clear.  Eyes:     Extraocular Movements:  Extraocular movements intact.     Conjunctiva/sclera: Conjunctivae normal.     Pupils: Pupils are equal, round, and reactive to light.  Cardiovascular:     Rate and Rhythm: Normal rate and regular rhythm.     Pulses: Normal pulses.     Heart sounds: Normal heart sounds.  Pulmonary:     Effort: Pulmonary effort is normal. No respiratory distress.     Breath sounds: Normal breath sounds. No rhonchi.  Abdominal:     General: Bowel sounds are normal.     Palpations: Abdomen is soft. There is no mass.     Tenderness: There is no abdominal tenderness.     Hernia: No hernia is present.  Musculoskeletal:        General: Normal range of motion.     Cervical back: Neck supple. No tenderness.  Skin:    General: Skin is warm.      Capillary Refill: Capillary refill takes less than 2 seconds.  Neurological:     General: No focal deficit present.     Mental Status: She is alert and oriented to person, place, and time. Mental status is at baseline.  Psychiatric:        Mood and Affect: Mood normal.        Behavior: Behavior normal.        Thought Content: Thought content normal.        Judgment: Judgment normal.     BP 124/80   Pulse 87   Ht _0  (1.575 m)   Wt 197 lb 6.4 oz (89.5 kg)   BMI 36.10 kg/m  Wt Readings from Last 3 Encounters:  10/05/21 197 lb 6.4 oz (89.5 kg)  05/03/21 212 lb 1.3 oz (96.2 kg)  09/12/19 212 lb (96.2 kg)     Health Maintenance  Topic Date Due   COVID-19 Vaccine (1) Never done   Hepatitis C Screening  Never done   TETANUS/TDAP  Never done   INFLUENZA VACCINE  Never done   PAP SMEAR-Modifier  11/24/2021   HIV Screening  Completed   HPV VACCINES  Aged Out    There are no preventive care reminders to display for this patient.  No results found for: "TSH" No results found for: "WBC", "HGB", "HCT", "MCV", "PLT" No results found for: "NA", "K", "CHLORIDE", "CO2", "GLUCOSE", "BUN", "CREATININE", "BILITOT", "ALKPHOS", "AST", "ALT", "PROT", "ALBUMIN", "CALCIUM", "ANIONGAP", "EGFR", "GFR" Lab Results  Component Value Date   CHOL 152 11/25/2018   Lab Results  Component Value Date   HDL 37 (L) 11/25/2018   Lab Results  Component Value Date   LDLCALC 101 (H) 11/25/2018   Lab Results  Component Value Date   TRIG 69 11/25/2018   Lab Results  Component Value Date   CHOLHDL 4.1 11/25/2018   No results found for: "HGBA1C"    Assessment & Plan:   Problem List Items Addressed This Visit       Other   Class 2 obesity with body mass index (BMI) of 36.0 to 36.9 in adult - Primary    Body mass index is 36.1 kg/m. Advised pt to lose weight. Advised patient to avoid trans fat, fatty and fried food. Follow a regular physical activity schedule. Went over the risk of  chronic diseases with increased weight.           Relevant Orders   CBC with Differential/Platelet   COMPLETE METABOLIC PANEL WITH GFR   Hemoglobin A1c   TSH   Lipid panel  Dry cough    Started her on [employer]. Advised patient to continue present 2-3 hours going to bed. We will start her on PPI if symptoms does not resolve.      Other Visit Diagnoses     Encounter for screening mammogram for malignant neoplasm of breast       Relevant Orders   MM 3D SCREEN BREAST BILATERAL        Meds ordered this encounter  Medications   benzonatate (TESSALON) 100 MG capsule    Sig: Take 1 capsule (100 mg total) by mouth 2 (two) times daily as needed for cough.    Dispense:  20 capsule    Refill:  0   loratadine (CLARITIN) 10 MG tablet    Sig: Take 1 tablet (10 mg total) by mouth daily.    Dispense:  30 tablet    Refill:  11     Follow-up: No follow-ups on file.    Theresia Lo, NP

## 2021-10-12 NOTE — Assessment & Plan Note (Signed)
Started her on [employer]. Advised patient to continue present 2-3 hours going to bed. We will start her on PPI if symptoms does not resolve.

## 2021-10-12 NOTE — Assessment & Plan Note (Signed)
Body mass index is 36.1 kg/m. Advised pt to lose weight. Advised patient to avoid trans fat, fatty and fried food. Follow a regular physical activity schedule. Went over the risk of chronic diseases with increased weight.

## 2021-10-19 ENCOUNTER — Ambulatory Visit: Payer: BC Managed Care – PPO | Admitting: Nurse Practitioner

## 2021-12-28 ENCOUNTER — Ambulatory Visit (INDEPENDENT_AMBULATORY_CARE_PROVIDER_SITE_OTHER): Payer: BC Managed Care – PPO | Admitting: Nurse Practitioner

## 2021-12-28 VITALS — BP 138/68 | HR 78 | Temp 98.6°F | Ht 62.0 in | Wt 195.6 lb

## 2021-12-28 DIAGNOSIS — Z131 Encounter for screening for diabetes mellitus: Secondary | ICD-10-CM

## 2021-12-28 DIAGNOSIS — Z0000 Encounter for general adult medical examination without abnormal findings: Secondary | ICD-10-CM

## 2021-12-28 NOTE — Progress Notes (Signed)
Established Patient Office Visit  Subjective:  Patient ID: Regina Schmidt, female    DOB: 1980/06/19  Age: 41 y.o. MRN: [id number]  CC:  Chief Complaint  Patient presents with   Annual Exam     HPI  [name] presents to the clinic for /her annual physical exam.  Flu: due COVID: moderna X2 Pap smear: due Dentist: as needed   Eye examination: due Exercise: walk 3 days a weekX 30 min  Diet: Patient does eat meat. Patient consumes fruits and veggies. Patient eat some fried food. Patient drinks fruit drinks, water and soda.  HPI   Past Medical History:  Diagnosis Date   Diabetes mellitus without complication (HCC)    gestational    Past Surgical History:  Procedure Laterality Date   CESAREAN SECTION     TUBAL LIGATION      Family History  Problem Relation Age of Onset   Hypertension Mother    Breast cancer Mother    Diabetes Father     Social History   Socioeconomic History   Marital status: Single    Spouse name: Not on file   Number of children: Not on file   Years of education: Not on file   Highest education level: Not on file  Occupational History   Not on file  Tobacco Use   Smoking status: Never   Smokeless tobacco: Never  Vaping Use   Vaping Use: Never used  Substance and Sexual Activity   Alcohol use: No   Drug use: No   Sexual activity: Yes    Birth control/protection: Surgical  Other Topics Concern   Not on file  Social History Narrative   Not on file   Social Determinants of Health   Financial Resource Strain: Not on file  Food Insecurity: Not on file  Transportation Needs: Not on file  Physical Activity: Not on file  Stress: Not on file  Social Connections: Not on file  Intimate Partner Violence: Not on file     Outpatient Medications Prior to Visit  Medication Sig Dispense Refill   albuterol (VENTOLIN HFA) 108 (90 Base) MCG/ACT inhaler Inhale 1-2 puffs into the lungs every 6 (six) hours as needed for wheezing or  shortness of breath. 18 g 0   benzonatate (TESSALON) 100 MG capsule Take 1 capsule (100 mg total) by mouth 2 (two) times daily as needed for cough. 20 capsule 0   loratadine (CLARITIN) 10 MG tablet Take 1 tablet (10 mg total) by mouth daily. 30 tablet 11   No facility-administered medications prior to visit.    Allergies  Allergen Reactions   Percocet [Oxycodone-Acetaminophen]     ROS Review of Systems  Constitutional: Negative.   HENT: Negative.    Eyes: Negative.   Respiratory:  Negative for chest tightness and shortness of breath.   Cardiovascular:  Negative for chest pain and palpitations.  Gastrointestinal: Negative.   Genitourinary: Negative.   Musculoskeletal: Negative.   Skin: Negative.   Neurological: Negative.   Psychiatric/Behavioral: Negative.        Objective:    Physical Exam Constitutional:      Appearance: Normal appearance. She is obese.  HENT:     Head: Normocephalic.     Right Ear: Tympanic membrane normal.     Left Ear: Tympanic membrane normal.     Nose: Nose normal.     Mouth/Throat:     Mouth: Mucous membranes are moist.     Pharynx: Oropharynx is clear.  Eyes:     Extraocular Movements: Extraocular movements intact.     Conjunctiva/sclera: Conjunctivae normal.     Pupils: Pupils are equal, round, and reactive to light.  Cardiovascular:     Rate and Rhythm: Normal rate and regular rhythm.     Pulses: Normal pulses.     Heart sounds: Normal heart sounds.  Pulmonary:     Effort: Pulmonary effort is normal. No respiratory distress.     Breath sounds: Normal breath sounds. No rhonchi.  Abdominal:     General: Bowel sounds are normal.     Palpations: Abdomen is soft. There is no mass.     Tenderness: There is no abdominal tenderness.     Hernia: No hernia is present.  Musculoskeletal:        General: Normal range of motion.     Cervical back: Neck supple. No tenderness.  Skin:    General: Skin is warm.     Capillary Refill: Capillary  refill takes less than 2 seconds.  Neurological:     General: No focal deficit present.     Mental Status: She is alert and oriented to person, place, and time. Mental status is at baseline.  Psychiatric:        Mood and Affect: Mood normal.        Behavior: Behavior normal.        Thought Content: Thought content normal.        Judgment: Judgment normal.     BP 138/68   Pulse 78   Temp 98.6 F (37 C)   Ht _0  (1.575 m)   Wt 195 lb 9 oz (88.7 kg)   BMI 35.77 kg/m  Wt Readings from Last 3 Encounters:  12/28/21 195 lb 9 oz (88.7 kg)  10/05/21 197 lb 6.4 oz (89.5 kg)  05/03/21 212 lb 1.3 oz (96.2 kg)     Health Maintenance  Topic Date Due   COVID-19 Vaccine (1) Never done   Hepatitis C Screening  Never done   DTaP/Tdap/Td (1 - Tdap) Never done   INFLUENZA VACCINE  Never done   PAP SMEAR-Modifier  11/24/2021   HIV Screening  Completed   HPV VACCINES  Aged Out    There are no preventive care reminders to display for this patient.  No results found for: "TSH" No results found for: "WBC", "HGB", "HCT", "MCV", "PLT" No results found for: "NA", "K", "CHLORIDE", "CO2", "GLUCOSE", "BUN", "CREATININE", "BILITOT", "ALKPHOS", "AST", "ALT", "PROT", "ALBUMIN", "CALCIUM", "ANIONGAP", "EGFR", "GFR" Lab Results  Component Value Date   CHOL 152 11/25/2018   Lab Results  Component Value Date   HDL 37 (L) 11/25/2018   Lab Results  Component Value Date   LDLCALC 101 (H) 11/25/2018   Lab Results  Component Value Date   TRIG 69 11/25/2018   Lab Results  Component Value Date   CHOLHDL 4.1 11/25/2018   No results found for: "HGBA1C"    Assessment & Plan:   Problem List Items Addressed This Visit       Other   Annual physical exam - Primary    Encouraged patient to consume a balanced diet and regular exercise regimen. Advised to see an eye doctor and dentist annually.        Relevant Orders   CBC with Differential/Platelet   COMPLETE METABOLIC PANEL WITH GFR    Hemoglobin A1c   Lipid panel   TSH   Screening for diabetes mellitus     No orders of the  defined types were placed in this encounter.    Follow-up: No follow-ups on file.    Theresia Lo, NP

## 2022-01-10 ENCOUNTER — Encounter: Payer: Self-pay | Admitting: Nurse Practitioner

## 2022-01-10 NOTE — Assessment & Plan Note (Signed)
Encouraged patient to consume a balanced diet and regular exercise regimen. Advised to see an eye doctor and dentist annually.
